# Patient Record
Sex: Female | Born: 1965 | Race: White | Hispanic: No | Marital: Married
Health system: Midwestern US, Academic
[De-identification: ages and names within clinical notes are randomized; demographics above are authoritative.]

---

## 2016-06-01 MED ORDER — HUMIRA PEN 40 MG/0.8 ML SC PNKT
1 refills | Status: DC
Start: 2016-06-01 — End: 2016-07-27

## 2016-06-16 ENCOUNTER — Ambulatory Visit: Admit: 2016-06-16 | Discharge: 2016-06-16 | Payer: Commercial Managed Care - HMO

## 2016-06-16 ENCOUNTER — Ambulatory Visit: Admit: 2016-06-16 | Discharge: 2016-06-16

## 2016-06-16 DIAGNOSIS — R5383 Other fatigue: ICD-10-CM

## 2016-06-16 DIAGNOSIS — Z006 Encounter for examination for normal comparison and control in clinical research program: ICD-10-CM

## 2016-06-16 DIAGNOSIS — K50919 Crohn's disease, unspecified, with unspecified complications: Principal | ICD-10-CM

## 2016-06-16 DIAGNOSIS — Z5181 Encounter for therapeutic drug level monitoring: ICD-10-CM

## 2016-06-16 DIAGNOSIS — K5900 Constipation, unspecified: ICD-10-CM

## 2016-06-16 LAB — CBC AND DIFF
Lab: 0.1 10*3/uL (ref 0–0.20)
Lab: 0.2 10*3/uL (ref 0–0.45)
Lab: 0.5 10*3/uL (ref 0–0.80)
Lab: 1 % (ref 0–2)
Lab: 14 % (ref 11–15)
Lab: 2 % (ref 0–5)
Lab: 2.3 10*3/uL (ref 1.0–4.8)
Lab: 28 % (ref 24–44)
Lab: 31 pg (ref 26–34)
Lab: 320 10*3/uL (ref 150–400)
Lab: 33 g/dL (ref 32.0–36.0)
Lab: 4.6 M/UL (ref 4.0–5.0)
Lab: 42 % (ref 36–45)
Lab: 5.3 10*3/uL (ref 1.8–7.0)
Lab: 6 % (ref 4–12)
Lab: 63 % (ref 41–77)
Lab: 8 FL (ref 7–11)
Lab: 8.4 10*3/uL (ref 4.5–11.0)
Lab: 91 FL (ref 80–100)

## 2016-06-16 LAB — C REACTIVE PROTEIN (CRP): Lab: 0.2 mg/dL (ref <1.0)

## 2016-06-16 LAB — ALBUMIN: Lab: 4.8 g/dL (ref 3.5–5.0)

## 2016-06-16 MED ORDER — GLUCAGON HCL 1 MG IJ SOLR
.4 [IU] | Freq: Once | SUBCUTANEOUS | 0 refills | Status: CP
Start: 2016-06-16 — End: ?
  Administered 2016-06-16: 21:00:00 0.4 mg via SUBCUTANEOUS

## 2016-06-16 MED ORDER — GLUCAGON HCL 1 MG IJ SOLR
.6 [IU] | Freq: Once | INTRAVENOUS | 0 refills | Status: CP
Start: 2016-06-16 — End: ?
  Administered 2016-06-16: 22:00:00 0.6 mg via INTRAVENOUS

## 2016-06-16 MED ORDER — BREEZA FOR NEUTRAL ABDOMINAL/PELVIC IMAGING PO SOLN
1650 mL | Freq: Once | ORAL | 0 refills | Status: AC
Start: 2016-06-16 — End: ?

## 2016-06-16 MED ORDER — GADOBENATE DIMEGLUMINE 529 MG/ML (0.1MMOL/0.2ML) IV SOLN
14 mL | Freq: Once | INTRAVENOUS | 0 refills | Status: CP
Start: 2016-06-16 — End: ?
  Administered 2016-06-16: 22:00:00 14 mL via INTRAVENOUS

## 2016-06-16 MED ORDER — SODIUM CHLORIDE 0.9 % IJ SOLN
50 mL | Freq: Once | INTRAVENOUS | 0 refills | Status: CP
Start: 2016-06-16 — End: ?
  Administered 2016-06-16: 22:00:00 50 mL via INTRAVENOUS

## 2016-07-05 ENCOUNTER — Ambulatory Visit: Admit: 2016-07-05 | Discharge: 2016-07-05 | Payer: Commercial Managed Care - HMO

## 2016-07-05 ENCOUNTER — Encounter: Admit: 2016-07-05 | Discharge: 2016-07-05 | Payer: BC Managed Care – PPO

## 2016-07-05 DIAGNOSIS — K56609 Unspecified intestinal obstruction, unspecified as to partial versus complete obstruction: Principal | ICD-10-CM

## 2016-07-27 ENCOUNTER — Encounter: Admit: 2016-07-27 | Discharge: 2016-07-27 | Payer: BC Managed Care – PPO

## 2016-07-27 MED ORDER — HUMIRA PEN 40 MG/0.8 ML SC PNKT
INJECTION | 2 refills | Status: AC
Start: 2016-07-27 — End: 2016-12-21

## 2016-09-27 ENCOUNTER — Ambulatory Visit: Admit: 2016-09-27 | Discharge: 2016-09-27 | Payer: Commercial Managed Care - HMO

## 2016-09-27 ENCOUNTER — Encounter: Admit: 2016-09-27 | Discharge: 2016-09-27 | Payer: BC Managed Care – PPO

## 2016-09-27 DIAGNOSIS — D229 Melanocytic nevi, unspecified: Principal | ICD-10-CM

## 2016-09-27 DIAGNOSIS — Z5181 Encounter for therapeutic drug level monitoring: Secondary | ICD-10-CM

## 2016-09-27 DIAGNOSIS — H5440 Blindness, one eye, unspecified eye: ICD-10-CM

## 2016-09-27 DIAGNOSIS — L918 Other hypertrophic disorders of the skin: ICD-10-CM

## 2016-09-27 DIAGNOSIS — Z79899 Other long term (current) drug therapy: Principal | ICD-10-CM

## 2016-09-27 DIAGNOSIS — Z9225 Personal history of immunosupression therapy: ICD-10-CM

## 2016-09-27 DIAGNOSIS — Z87828 Personal history of other (healed) physical injury and trauma: ICD-10-CM

## 2016-09-27 DIAGNOSIS — N200 Calculus of kidney: ICD-10-CM

## 2016-09-27 DIAGNOSIS — K5090 Crohn's disease, unspecified, without complications: Principal | ICD-10-CM

## 2016-09-27 DIAGNOSIS — B078 Other viral warts: ICD-10-CM

## 2016-09-27 DIAGNOSIS — K50919 Crohn's disease, unspecified, with unspecified complications: Secondary | ICD-10-CM

## 2016-09-27 LAB — COMPREHENSIVE METABOLIC PANEL
Lab: 0.3 mg/dL (ref 0.3–1.2)
Lab: 0.7 mg/dL — ABNORMAL HIGH (ref 0.4–1.00)
Lab: 107 MMOL/L (ref 98–110)
Lab: 109 mg/dL — ABNORMAL HIGH (ref 70–100)
Lab: 141 MMOL/L (ref 137–147)
Lab: 16 mg/dL (ref 7–25)
Lab: 17 U/L (ref 7–40)
Lab: 18 U/L (ref 7–56)
Lab: 26 MMOL/L (ref 21–30)
Lab: 3.7 MMOL/L (ref 3.5–5.1)
Lab: 4.3 g/dL (ref 3.5–5.0)
Lab: 7.3 g/dL (ref 6.0–8.0)
Lab: 8 (ref 3–12)
Lab: 9.8 mg/dL (ref 8.5–10.6)
Lab: 90 U/L (ref 25–110)
Lab: >60 mL/min (ref >60)
Lab: >60 mL/min (ref >60)

## 2016-09-27 LAB — CBC
Lab: 4.3 M/UL (ref 4.0–5.0)
Lab: 6.7 K/UL — AB (ref 4.5–11.0)

## 2016-09-27 NOTE — Progress Notes
ATTESTATION    I personally performed the key portions of the E/M visit, discussed case with resident and concur with resident documentation of history, physical exam, assessment, and treatment plan unless otherwise noted.    Staff name:  Yailyn Strack Wang-Weinman, MD Date:  09/27/2016

## 2016-12-20 ENCOUNTER — Encounter: Admit: 2016-12-20 | Discharge: 2016-12-20 | Payer: BC Managed Care – PPO

## 2016-12-21 MED ORDER — HUMIRA PEN 40 MG/0.8 ML SC PNKT
1 refills | Status: AC
Start: 2016-12-21 — End: 2017-03-21

## 2016-12-21 NOTE — Telephone Encounter
Pt was last seen 05/22/2016 by Dr. Caesar Chestnut. r

## 2016-12-21 NOTE — Telephone Encounter
Last OV: 05/22/2016  Last labs: 09/27/16     Ref. Range 09/27/2016 14:26   Hemoglobin Latest Ref Range: 12.0 - 15.0 GM/DL 13.5   Hematocrit Latest Ref Range: 36 - 45 % 39.7   Platelet Count Latest Ref Range: 150 - 400 K/UL 299   Inda Blood Cells Latest Ref Range: 4.5 - 11.0 K/UL 6.7   RBC Latest Ref Range: 4.0 - 5.0 M/UL 4.38   MCV Latest Ref Range: 80 - 100 FL 90.7   MCH Latest Ref Range: 26 - 34 PG 30.8   MCHC Latest Ref Range: 32.0 - 36.0 G/DL 34.0   MPV Latest Ref Range: 7 - 11 FL 8.4   RDW Latest Ref Range: 11 - 15 % 15.2 (H)   Sodium Latest Ref Range: 137 - 147 MMOL/L 141   Potassium Latest Ref Range: 3.5 - 5.1 MMOL/L 3.7   Chloride Latest Ref Range: 98 - 110 MMOL/L 107   CO2 Latest Ref Range: 21 - 30 MMOL/L 26   Anion Gap Latest Ref Range: 3 - 12  8   Blood Urea Nitrogen Latest Ref Range: 7 - 25 MG/DL 16   Creatinine Latest Ref Range: 0.4 - 1.00 MG/DL 0.71   eGFR Non African American Latest Ref Range: >60 mL/min >60   eGFR African American Latest Ref Range: >60 mL/min >60   Glucose Latest Ref Range: 70 - 100 MG/DL 109 (H)   Albumin Latest Ref Range: 3.5 - 5.0 G/DL 4.3   Calcium Latest Ref Range: 8.5 - 10.6 MG/DL 9.8   Total Bilirubin Latest Ref Range: 0.3 - 1.2 MG/DL 0.3   Total Protein Latest Ref Range: 6.0 - 8.0 G/DL 7.3     Routing to Dr, Caesar Chestnut to approve/deny

## 2017-03-12 ENCOUNTER — Ambulatory Visit: Admit: 2017-03-12 | Discharge: 2017-03-12 | Payer: Commercial Managed Care - HMO

## 2017-03-12 ENCOUNTER — Encounter: Admit: 2017-03-12 | Discharge: 2017-03-12 | Payer: BC Managed Care – PPO

## 2017-03-12 DIAGNOSIS — K50819 Crohn's disease of both small and large intestine with unspecified complications: Secondary | ICD-10-CM

## 2017-03-12 DIAGNOSIS — K625 Hemorrhage of anus and rectum: ICD-10-CM

## 2017-03-12 DIAGNOSIS — K5090 Crohn's disease, unspecified, without complications: Principal | ICD-10-CM

## 2017-03-12 DIAGNOSIS — M8580 Other specified disorders of bone density and structure, unspecified site: ICD-10-CM

## 2017-03-12 DIAGNOSIS — D848 Other specified immunodeficiencies: ICD-10-CM

## 2017-03-12 DIAGNOSIS — T50905A Adverse effect of unspecified drugs, medicaments and biological substances, initial encounter: ICD-10-CM

## 2017-03-12 DIAGNOSIS — Z79899 Other long term (current) drug therapy: ICD-10-CM

## 2017-03-12 DIAGNOSIS — K5900 Constipation, unspecified: ICD-10-CM

## 2017-03-12 DIAGNOSIS — H5440 Blindness, one eye, unspecified eye: ICD-10-CM

## 2017-03-12 DIAGNOSIS — K50919 Crohn's disease, unspecified, with unspecified complications: Principal | ICD-10-CM

## 2017-03-12 DIAGNOSIS — D849 Immunodeficiency, unspecified: ICD-10-CM

## 2017-03-12 DIAGNOSIS — D899 Disorder involving the immune mechanism, unspecified: ICD-10-CM

## 2017-03-12 DIAGNOSIS — K921 Melena: ICD-10-CM

## 2017-03-12 DIAGNOSIS — N200 Calculus of kidney: ICD-10-CM

## 2017-03-12 MED ORDER — SENNOSIDES 8.6 MG PO TAB
2 | ORAL_TABLET | Freq: Every evening | ORAL | 3 refills | Status: AC | PRN
Start: 2017-03-12 — End: 2017-09-03

## 2017-03-12 MED ORDER — SODIUM,POTASSIUM,MAG SULFATES 17.5-3.13-1.6 GRAM PO SOLR
1 | ORAL | 0 refills | 30.00000 days | Status: AC
Start: 2017-03-12 — End: 2017-12-20

## 2017-03-12 MED ORDER — AZATHIOPRINE 50 MG PO TAB
50 mg | ORAL_TABLET | Freq: Every day | ORAL | 1 refills | Status: AC
Start: 2017-03-12 — End: 2017-06-06

## 2017-03-12 MED ORDER — OMEPRAZOLE 40 MG PO CPDR
40 mg | ORAL_CAPSULE | Freq: Every day | ORAL | 1 refills | Status: AC
Start: 2017-03-12 — End: 2017-03-12

## 2017-03-13 LAB — VITAMIN B12: Lab: 344 pg/mL (ref 180–914)

## 2017-03-13 LAB — COMPREHENSIVE METABOLIC PANEL
Lab: 0.4 mg/dL (ref 0.3–1.2)
Lab: 0.7 mg/dL (ref 0.4–1.00)
Lab: 108 MMOL/L (ref 98–110)
Lab: 141 MMOL/L (ref 137–147)
Lab: 18 mg/dL (ref 7–25)
Lab: 4.1 MMOL/L (ref 3.5–5.1)
Lab: 4.6 g/dL (ref 3.5–5.0)
Lab: 7.5 g/dL (ref 6.0–8.0)
Lab: 88 mg/dL (ref 70–100)
Lab: 9.8 mg/dL (ref 8.5–10.6)

## 2017-03-13 LAB — CBC
Lab: 13 g/dL (ref 12.0–15.0)
Lab: 4.5 M/UL (ref 4.0–5.0)
Lab: 40 % (ref 36–45)
Lab: 7.9 10*3/uL (ref 4.5–11.0)

## 2017-03-13 LAB — 25-OH VITAMIN D (D2 + D3): Lab: 23 ng/mL — ABNORMAL LOW (ref 30–80)

## 2017-03-13 LAB — C REACTIVE PROTEIN (CRP): Lab: 0.2 mg/dL (ref <1.0)

## 2017-03-15 ENCOUNTER — Encounter: Admit: 2017-03-15 | Discharge: 2017-03-15 | Payer: BC Managed Care – PPO

## 2017-03-15 ENCOUNTER — Ambulatory Visit: Admit: 2017-03-15 | Discharge: 2017-03-15 | Payer: Commercial Managed Care - HMO

## 2017-03-15 DIAGNOSIS — D848 Other specified immunodeficiencies: ICD-10-CM

## 2017-03-15 DIAGNOSIS — T50905A Adverse effect of unspecified drugs, medicaments and biological substances, initial encounter: ICD-10-CM

## 2017-03-15 DIAGNOSIS — D849 Immunodeficiency, unspecified: ICD-10-CM

## 2017-03-15 DIAGNOSIS — K50819 Crohn's disease of both small and large intestine with unspecified complications: ICD-10-CM

## 2017-03-15 DIAGNOSIS — Z79899 Other long term (current) drug therapy: ICD-10-CM

## 2017-03-15 DIAGNOSIS — K625 Hemorrhage of anus and rectum: Secondary | ICD-10-CM

## 2017-03-15 DIAGNOSIS — K50919 Crohn's disease, unspecified, with unspecified complications: Principal | ICD-10-CM

## 2017-03-15 DIAGNOSIS — M8580 Other specified disorders of bone density and structure, unspecified site: ICD-10-CM

## 2017-03-15 MED ORDER — CHOLECALCIFEROL (VITAMIN D3) 50,000 UNIT PO CAP
ORAL_CAPSULE | ORAL | 0 refills | 84.00000 days | Status: AC
Start: 2017-03-15 — End: 2017-09-03

## 2017-03-21 ENCOUNTER — Encounter: Admit: 2017-03-21 | Discharge: 2017-03-21 | Payer: BC Managed Care – PPO

## 2017-03-21 MED ORDER — HUMIRA PEN 40 MG/0.8 ML SC PNKT
INJECTION | 0 refills | Status: AC
Start: 2017-03-21 — End: 2017-06-04

## 2017-04-19 ENCOUNTER — Encounter: Admit: 2017-04-19 | Discharge: 2017-04-19 | Payer: BC Managed Care – PPO

## 2017-04-19 DIAGNOSIS — N200 Calculus of kidney: ICD-10-CM

## 2017-04-19 DIAGNOSIS — H5440 Blindness, one eye, unspecified eye: ICD-10-CM

## 2017-04-19 DIAGNOSIS — K5090 Crohn's disease, unspecified, without complications: Principal | ICD-10-CM

## 2017-05-05 ENCOUNTER — Encounter: Admit: 2017-05-05 | Discharge: 2017-05-05 | Payer: BC Managed Care – PPO

## 2017-05-05 DIAGNOSIS — K50919 Crohn's disease, unspecified, with unspecified complications: ICD-10-CM

## 2017-05-05 DIAGNOSIS — R7989 Other specified abnormal findings of blood chemistry: Principal | ICD-10-CM

## 2017-05-07 MED ORDER — CHOLECALCIFEROL (VITAMIN D3) 50,000 UNIT PO CAP
ORAL_CAPSULE | 0 refills
Start: 2017-05-07 — End: ?

## 2017-05-15 ENCOUNTER — Encounter: Admit: 2017-05-15 | Discharge: 2017-05-15 | Payer: BC Managed Care – PPO

## 2017-05-30 ENCOUNTER — Encounter: Admit: 2017-05-30 | Discharge: 2017-05-31 | Payer: BC Managed Care – PPO

## 2017-05-30 ENCOUNTER — Encounter: Admit: 2017-05-30 | Discharge: 2017-05-30 | Payer: BC Managed Care – PPO

## 2017-05-30 ENCOUNTER — Ambulatory Visit: Admit: 2017-05-30 | Discharge: 2017-05-30 | Payer: BC Managed Care – PPO

## 2017-05-30 DIAGNOSIS — K625 Hemorrhage of anus and rectum: ICD-10-CM

## 2017-05-30 DIAGNOSIS — Z5181 Encounter for therapeutic drug level monitoring: ICD-10-CM

## 2017-05-30 DIAGNOSIS — D849 Immunodeficiency, unspecified: ICD-10-CM

## 2017-05-30 DIAGNOSIS — D848 Other specified immunodeficiencies: ICD-10-CM

## 2017-05-30 DIAGNOSIS — Z79899 Other long term (current) drug therapy: ICD-10-CM

## 2017-05-30 DIAGNOSIS — D899 Disorder involving the immune mechanism, unspecified: ICD-10-CM

## 2017-05-30 DIAGNOSIS — K921 Melena: ICD-10-CM

## 2017-05-30 DIAGNOSIS — K50819 Crohn's disease of both small and large intestine with unspecified complications: ICD-10-CM

## 2017-05-30 DIAGNOSIS — K5090 Crohn's disease, unspecified, without complications: ICD-10-CM

## 2017-05-30 DIAGNOSIS — T50905A Adverse effect of unspecified drugs, medicaments and biological substances, initial encounter: ICD-10-CM

## 2017-05-30 DIAGNOSIS — R7989 Other specified abnormal findings of blood chemistry: Principal | ICD-10-CM

## 2017-05-30 DIAGNOSIS — K50919 Crohn's disease, unspecified, with unspecified complications: Principal | ICD-10-CM

## 2017-05-30 LAB — CBC
Lab: 4.7 M/UL (ref 4.0–5.0)
Lab: 8.6 K/UL (ref 4.5–11.0)

## 2017-05-30 LAB — C REACTIVE PROTEIN (CRP): Lab: 0.3 mg/dL (ref <1.0)

## 2017-05-30 LAB — COMPREHENSIVE METABOLIC PANEL
Lab: 0.5 mg/dL (ref >60)
Lab: 0.7 mg/dL — ABNORMAL HIGH (ref 0.4–1.00)
Lab: 124 mg/dL — ABNORMAL HIGH (ref 70–100)
Lab: 140 MMOL/L (ref 137–147)
Lab: 16 mg/dL (ref 7–25)
Lab: 17 U/L (ref 7–56)
Lab: 21 U/L (ref 7–40)
Lab: 29 MMOL/L (ref 21–30)
Lab: 3.6 MMOL/L — ABNORMAL LOW (ref 3.5–5.1)
Lab: 4.6 g/dL (ref 3.5–5.0)
Lab: 7 (ref 3–12)
Lab: 7.6 g/dL (ref >60)
Lab: 9.9 mg/dL (ref 8.5–10.6)
Lab: 96 U/L (ref 25–110)
Lab: >60 mL/min (ref >60)
Lab: >60 mL/min — ABNORMAL HIGH (ref >60)

## 2017-05-30 LAB — MISC REFERENCE TEST

## 2017-05-31 LAB — 25-OH VITAMIN D (D2 + D3): Lab: 57 ng/mL (ref 30–80)

## 2017-06-01 ENCOUNTER — Encounter: Admit: 2017-06-01 | Discharge: 2017-06-01 | Payer: BC Managed Care – PPO

## 2017-06-04 MED ORDER — HUMIRA PEN 40 MG/0.8 ML SC PNKT
0 refills | Status: AC
Start: 2017-06-04 — End: 2017-09-03

## 2017-06-05 ENCOUNTER — Encounter: Admit: 2017-06-05 | Discharge: 2017-06-05 | Payer: BC Managed Care – PPO

## 2017-06-05 DIAGNOSIS — K50919 Crohn's disease, unspecified, with unspecified complications: Principal | ICD-10-CM

## 2017-06-05 MED ORDER — AZATHIOPRINE 50 MG PO TAB
ORAL_TABLET | Freq: Every day | 0 refills
Start: 2017-06-05 — End: ?

## 2017-06-06 MED ORDER — AZATHIOPRINE 50 MG PO TAB
50 mg | ORAL_TABLET | Freq: Every day | ORAL | 1 refills | Status: AC
Start: 2017-06-06 — End: 2017-06-08

## 2017-06-07 ENCOUNTER — Encounter: Admit: 2017-06-07 | Discharge: 2017-06-07 | Payer: BC Managed Care – PPO

## 2017-06-08 MED ORDER — AZATHIOPRINE 50 MG PO TAB
50 mg | ORAL_TABLET | Freq: Every day | ORAL | 1 refills | Status: AC
Start: 2017-06-08 — End: 2017-09-03

## 2017-06-13 ENCOUNTER — Encounter: Admit: 2017-06-13 | Discharge: 2017-06-13 | Payer: BC Managed Care – PPO

## 2017-06-16 ENCOUNTER — Ambulatory Visit: Admit: 2017-06-16 | Discharge: 2017-06-16 | Payer: BC Managed Care – PPO

## 2017-06-16 DIAGNOSIS — K50919 Crohn's disease, unspecified, with unspecified complications: Principal | ICD-10-CM

## 2017-06-16 MED ORDER — GLUCAGON HCL 1 MG IJ SOLR
1 [IU] | Freq: Once | SUBCUTANEOUS | 0 refills | Status: CP
Start: 2017-06-16 — End: ?
  Administered 2017-06-16: 14:00:00 1 mg via SUBCUTANEOUS

## 2017-06-16 MED ORDER — GADOBENATE DIMEGLUMINE 529 MG/ML (0.1MMOL/0.2ML) IV SOLN
15 mL | Freq: Once | INTRAVENOUS | 0 refills | Status: CP
Start: 2017-06-16 — End: ?
  Administered 2017-06-16: 15:00:00 13 mL via INTRAVENOUS

## 2017-06-16 MED ORDER — BREEZA FOR NEUTRAL ABDOMINAL/PELVIC IMAGING PO SOLN
1000 mL | Freq: Once | ORAL | 0 refills | Status: CP
Start: 2017-06-16 — End: ?

## 2017-06-16 MED ORDER — SODIUM CHLORIDE 0.9 % IJ SOLN
50 mL | Freq: Once | INTRAVENOUS | 0 refills | Status: CP
Start: 2017-06-16 — End: ?
  Administered 2017-06-16: 15:00:00 50 mL via INTRAVENOUS

## 2017-06-18 ENCOUNTER — Encounter: Admit: 2017-06-18 | Discharge: 2017-06-18 | Payer: BC Managed Care – PPO

## 2017-06-20 ENCOUNTER — Encounter: Admit: 2017-06-20 | Discharge: 2017-06-20 | Payer: BC Managed Care – PPO

## 2017-06-20 DIAGNOSIS — D899 Disorder involving the immune mechanism, unspecified: ICD-10-CM

## 2017-06-20 DIAGNOSIS — K5000 Crohn's disease of small intestine without complications: Principal | ICD-10-CM

## 2017-06-27 DIAGNOSIS — K5900 Constipation, unspecified: ICD-10-CM

## 2017-06-27 DIAGNOSIS — K625 Hemorrhage of anus and rectum: ICD-10-CM

## 2017-06-27 DIAGNOSIS — K921 Melena: ICD-10-CM

## 2017-07-04 ENCOUNTER — Encounter: Admit: 2017-07-04 | Discharge: 2017-07-04 | Payer: BC Managed Care – PPO

## 2017-07-04 ENCOUNTER — Ambulatory Visit: Admit: 2017-07-04 | Discharge: 2017-07-05 | Payer: BC Managed Care – PPO

## 2017-07-04 DIAGNOSIS — K50119 Crohn's disease of large intestine with unspecified complications: Principal | ICD-10-CM

## 2017-07-04 DIAGNOSIS — N200 Calculus of kidney: ICD-10-CM

## 2017-07-04 DIAGNOSIS — K5090 Crohn's disease, unspecified, without complications: Principal | ICD-10-CM

## 2017-07-04 DIAGNOSIS — H5440 Blindness, one eye, unspecified eye: ICD-10-CM

## 2017-07-06 ENCOUNTER — Encounter: Admit: 2017-07-06 | Discharge: 2017-07-06 | Payer: BC Managed Care – PPO

## 2017-07-06 DIAGNOSIS — D509 Iron deficiency anemia, unspecified: ICD-10-CM

## 2017-07-06 DIAGNOSIS — K50919 Crohn's disease, unspecified, with unspecified complications: Principal | ICD-10-CM

## 2017-07-06 DIAGNOSIS — Z79899 Other long term (current) drug therapy: ICD-10-CM

## 2017-07-06 DIAGNOSIS — D899 Disorder involving the immune mechanism, unspecified: ICD-10-CM

## 2017-07-06 DIAGNOSIS — Z5181 Encounter for therapeutic drug level monitoring: ICD-10-CM

## 2017-07-08 ENCOUNTER — Encounter: Admit: 2017-07-08 | Discharge: 2017-07-08 | Payer: BC Managed Care – PPO

## 2017-07-08 DIAGNOSIS — K5090 Crohn's disease, unspecified, without complications: Principal | ICD-10-CM

## 2017-07-08 DIAGNOSIS — N200 Calculus of kidney: ICD-10-CM

## 2017-07-08 DIAGNOSIS — H5440 Blindness, one eye, unspecified eye: ICD-10-CM

## 2017-07-10 ENCOUNTER — Ambulatory Visit: Admit: 2017-07-10 | Discharge: 2017-07-10 | Payer: BC Managed Care – PPO

## 2017-07-10 DIAGNOSIS — K5000 Crohn's disease of small intestine without complications: Principal | ICD-10-CM

## 2017-07-10 DIAGNOSIS — D899 Disorder involving the immune mechanism, unspecified: ICD-10-CM

## 2017-07-12 MED ORDER — SODIUM,POTASSIUM,MAG SULFATES 17.5-3.13-1.6 GRAM PO SOLR
1 | ORAL | 0 refills | 30.00000 days | Status: AC
Start: 2017-07-12 — End: 2017-09-03

## 2017-07-24 ENCOUNTER — Ambulatory Visit: Admit: 2017-07-24 | Discharge: 2017-07-24 | Payer: BC Managed Care – PPO

## 2017-07-25 ENCOUNTER — Ambulatory Visit: Admit: 2017-07-25 | Discharge: 2017-07-25 | Payer: BC Managed Care – PPO

## 2017-07-25 ENCOUNTER — Encounter: Admit: 2017-07-25 | Discharge: 2017-07-25 | Payer: BC Managed Care – PPO

## 2017-07-25 ENCOUNTER — Ambulatory Visit: Admit: 2017-07-25 | Discharge: 2017-07-26 | Payer: BC Managed Care – PPO

## 2017-07-25 DIAGNOSIS — H5440 Blindness, one eye, unspecified eye: ICD-10-CM

## 2017-07-25 DIAGNOSIS — N200 Calculus of kidney: ICD-10-CM

## 2017-07-25 DIAGNOSIS — K5090 Crohn's disease, unspecified, without complications: Principal | ICD-10-CM

## 2017-07-25 MED ORDER — ONDANSETRON HCL (PF) 4 MG/2 ML IJ SOLN
4 mg | Freq: Once | INTRAVENOUS | 0 refills | Status: AC | PRN
Start: 2017-07-25 — End: ?

## 2017-07-25 MED ORDER — LIDOCAINE (PF) 200 MG/10 ML (2 %) IJ SYRG
0 refills | Status: DC
Start: 2017-07-25 — End: 2017-07-25

## 2017-07-25 MED ORDER — PROPOFOL 10 MG/ML IV EMUL 20 ML (INFUSION)(AM)(OR)
INTRAVENOUS | 0 refills | Status: DC
Start: 2017-07-25 — End: 2017-07-25

## 2017-07-25 MED ORDER — SIMETHICONE 40 MG/0.6 ML PO DRPS
0 refills | Status: DC
Start: 2017-07-25 — End: 2017-07-30

## 2017-07-25 MED ORDER — PROPOFOL INJ 10 MG/ML IV VIAL
0 refills | Status: DC
Start: 2017-07-25 — End: 2017-07-25

## 2017-07-25 MED ORDER — LACTATED RINGERS IV SOLP
INTRAVENOUS | 0 refills | Status: AC
Start: 2017-07-25 — End: ?

## 2017-07-25 MED ORDER — LIDOCAINE (PF) 10 MG/ML (1 %) IJ SOLN
.1-2 mL | INTRAMUSCULAR | 0 refills | Status: DC | PRN
Start: 2017-07-25 — End: 2017-11-02

## 2017-07-26 ENCOUNTER — Encounter: Admit: 2017-07-26 | Discharge: 2017-07-26 | Payer: BC Managed Care – PPO

## 2017-07-26 DIAGNOSIS — K5090 Crohn's disease, unspecified, without complications: Principal | ICD-10-CM

## 2017-07-26 DIAGNOSIS — H5440 Blindness, one eye, unspecified eye: ICD-10-CM

## 2017-07-26 DIAGNOSIS — N200 Calculus of kidney: ICD-10-CM

## 2017-08-06 ENCOUNTER — Ambulatory Visit: Admit: 2017-08-06 | Discharge: 2017-08-07 | Payer: BC Managed Care – PPO

## 2017-08-17 ENCOUNTER — Encounter: Admit: 2017-08-17 | Discharge: 2017-08-17 | Payer: BC Managed Care – PPO

## 2017-08-17 DIAGNOSIS — N1330 Unspecified hydronephrosis: Principal | ICD-10-CM

## 2017-08-17 DIAGNOSIS — N134 Hydroureter: ICD-10-CM

## 2017-09-03 ENCOUNTER — Ambulatory Visit: Admit: 2017-09-03 | Discharge: 2017-09-03 | Payer: BC Managed Care – PPO

## 2017-09-03 ENCOUNTER — Encounter: Admit: 2017-09-03 | Discharge: 2017-09-03 | Payer: BC Managed Care – PPO

## 2017-09-03 DIAGNOSIS — N200 Calculus of kidney: ICD-10-CM

## 2017-09-03 DIAGNOSIS — K921 Melena: ICD-10-CM

## 2017-09-03 DIAGNOSIS — Z79899 Other long term (current) drug therapy: ICD-10-CM

## 2017-09-03 DIAGNOSIS — K50819 Crohn's disease of both small and large intestine with unspecified complications: ICD-10-CM

## 2017-09-03 DIAGNOSIS — T50905A Adverse effect of unspecified drugs, medicaments and biological substances, initial encounter: ICD-10-CM

## 2017-09-03 DIAGNOSIS — K635 Polyp of colon: ICD-10-CM

## 2017-09-03 DIAGNOSIS — K625 Hemorrhage of anus and rectum: ICD-10-CM

## 2017-09-03 DIAGNOSIS — D848 Other specified immunodeficiencies: ICD-10-CM

## 2017-09-03 DIAGNOSIS — D899 Disorder involving the immune mechanism, unspecified: ICD-10-CM

## 2017-09-03 DIAGNOSIS — N134 Hydroureter: ICD-10-CM

## 2017-09-03 DIAGNOSIS — K50919 Crohn's disease, unspecified, with unspecified complications: Principal | ICD-10-CM

## 2017-09-03 DIAGNOSIS — N1330 Unspecified hydronephrosis: ICD-10-CM

## 2017-09-03 DIAGNOSIS — D649 Anemia, unspecified: ICD-10-CM

## 2017-09-03 DIAGNOSIS — H5440 Blindness, one eye, unspecified eye: ICD-10-CM

## 2017-09-03 DIAGNOSIS — K5090 Crohn's disease, unspecified, without complications: Principal | ICD-10-CM

## 2017-09-03 LAB — CBC
Lab: 12 g/dL (ref 12.0–15.0)
Lab: 16 % — ABNORMAL HIGH (ref 11–15)
Lab: 304 10*3/uL (ref 150–400)
Lab: 33 g/dL (ref 32.0–36.0)
Lab: 36 % (ref 36–45)
Lab: 4.2 M/UL (ref 4.0–5.0)
Lab: 6.6 10*3/uL (ref 4.5–11.0)
Lab: 8 FL (ref 7–11)
Lab: 85 FL (ref 80–100)

## 2017-09-03 LAB — C REACTIVE PROTEIN (CRP): Lab: 0 mg/dL (ref <1.0)

## 2017-09-03 LAB — COMPREHENSIVE METABOLIC PANEL
Lab: 139 MMOL/L (ref 137–147)
Lab: 18 U/L (ref 7–56)
Lab: 28 MMOL/L (ref 21–30)
Lab: 4.5 MMOL/L (ref 3.5–5.1)
Lab: >60 mL/min (ref >60)
Lab: >60 mL/min (ref >60)
Lab: 5 (ref 3–12)

## 2017-09-06 ENCOUNTER — Encounter: Admit: 2017-09-06 | Discharge: 2017-09-06 | Payer: BC Managed Care – PPO

## 2017-09-09 ENCOUNTER — Encounter: Admit: 2017-09-09 | Discharge: 2017-09-09 | Payer: BC Managed Care – PPO

## 2017-09-09 DIAGNOSIS — N200 Calculus of kidney: ICD-10-CM

## 2017-09-09 DIAGNOSIS — K5090 Crohn's disease, unspecified, without complications: Principal | ICD-10-CM

## 2017-09-09 DIAGNOSIS — H5440 Blindness, one eye, unspecified eye: ICD-10-CM

## 2017-09-09 DIAGNOSIS — K635 Polyp of colon: ICD-10-CM

## 2017-09-09 DIAGNOSIS — D649 Anemia, unspecified: ICD-10-CM

## 2017-09-18 ENCOUNTER — Ambulatory Visit: Admit: 2017-09-18 | Discharge: 2017-09-18 | Payer: BC Managed Care – PPO

## 2017-09-18 DIAGNOSIS — N200 Calculus of kidney: Principal | ICD-10-CM

## 2017-09-24 ENCOUNTER — Encounter: Admit: 2017-09-24 | Discharge: 2017-09-24 | Payer: BC Managed Care – PPO

## 2017-11-02 ENCOUNTER — Encounter: Admit: 2017-11-02 | Discharge: 2017-11-02 | Payer: BC Managed Care – PPO

## 2017-11-02 DIAGNOSIS — N200 Calculus of kidney: ICD-10-CM

## 2017-11-02 DIAGNOSIS — K635 Polyp of colon: ICD-10-CM

## 2017-11-02 DIAGNOSIS — H5440 Blindness, one eye, unspecified eye: ICD-10-CM

## 2017-11-02 DIAGNOSIS — K5090 Crohn's disease, unspecified, without complications: ICD-10-CM

## 2017-11-02 DIAGNOSIS — D649 Anemia, unspecified: ICD-10-CM

## 2017-11-05 ENCOUNTER — Encounter: Admit: 2017-11-05 | Discharge: 2017-11-05 | Payer: BC Managed Care – PPO

## 2017-11-05 ENCOUNTER — Ambulatory Visit: Admit: 2017-11-05 | Discharge: 2017-11-06 | Payer: BC Managed Care – PPO

## 2017-11-05 MED ORDER — SODIUM,POTASSIUM,MAG SULFATES 17.5-3.13-1.6 GRAM PO SOLR
0 refills | 30.00000 days | Status: DC
Start: 2017-11-05 — End: 2017-12-20
  Filled 2017-11-05 (×2): qty 354, 1d supply, fill #1

## 2017-11-07 ENCOUNTER — Ambulatory Visit: Admit: 2017-11-07 | Discharge: 2017-11-07 | Payer: BC Managed Care – PPO

## 2017-11-07 ENCOUNTER — Encounter: Admit: 2017-11-07 | Discharge: 2017-11-07 | Payer: BC Managed Care – PPO

## 2017-11-07 ENCOUNTER — Ambulatory Visit: Admit: 2017-11-07 | Discharge: 2017-11-08 | Payer: BC Managed Care – PPO

## 2017-11-07 DIAGNOSIS — N200 Calculus of kidney: ICD-10-CM

## 2017-11-07 DIAGNOSIS — H5440 Blindness, one eye, unspecified eye: ICD-10-CM

## 2017-11-07 DIAGNOSIS — K635 Polyp of colon: ICD-10-CM

## 2017-11-07 DIAGNOSIS — D649 Anemia, unspecified: ICD-10-CM

## 2017-11-07 DIAGNOSIS — K5090 Crohn's disease, unspecified, without complications: Principal | ICD-10-CM

## 2017-11-07 MED ORDER — ONDANSETRON HCL (PF) 4 MG/2 ML IJ SOLN
4 mg | Freq: Once | INTRAVENOUS | 0 refills | Status: AC | PRN
Start: 2017-11-07 — End: ?

## 2017-11-07 MED ORDER — FENTANYL CITRATE (PF) 50 MCG/ML IJ SOLN
25 ug | INTRAVENOUS | 0 refills | Status: DC | PRN
Start: 2017-11-07 — End: 2017-11-12

## 2017-11-07 MED ORDER — PROPOFOL INJ 10 MG/ML IV VIAL
0 refills | Status: DC
Start: 2017-11-07 — End: 2017-11-07

## 2017-11-07 MED ORDER — ONDANSETRON 4 MG PO TBDI
4 mg | Freq: Once | ORAL | 0 refills | Status: AC | PRN
Start: 2017-11-07 — End: ?

## 2017-11-07 MED ORDER — LACTATED RINGERS IV SOLP
1000 mL | INTRAVENOUS | 0 refills | Status: DC
Start: 2017-11-07 — End: 2017-11-12

## 2017-11-07 MED ORDER — GLYCOPYRROLATE 0.2 MG/ML IJ SOLN
.2 mg | Freq: Once | INTRAVENOUS | 0 refills | Status: AC | PRN
Start: 2017-11-07 — End: ?

## 2017-11-07 MED ORDER — SIMETHICONE 40 MG/0.6 ML PO DRPS
0 refills | Status: DC
Start: 2017-11-07 — End: 2017-11-12

## 2017-11-07 MED ORDER — PROPOFOL 10 MG/ML IV EMUL 20 ML (INFUSION)(AM)(OR)
INTRAVENOUS | 0 refills | Status: DC
Start: 2017-11-07 — End: 2017-11-07

## 2017-11-07 MED ORDER — LIDOCAINE (PF) 10 MG/ML (1 %) IJ SOLN
.1-2 mL | INTRAMUSCULAR | 0 refills | Status: DC | PRN
Start: 2017-11-07 — End: 2017-11-12

## 2017-11-08 ENCOUNTER — Encounter: Admit: 2017-11-08 | Discharge: 2017-11-08 | Payer: BC Managed Care – PPO

## 2017-11-08 ENCOUNTER — Ambulatory Visit: Admit: 2017-11-08 | Discharge: 2017-11-09 | Payer: BC Managed Care – PPO

## 2017-11-08 DIAGNOSIS — K5090 Crohn's disease, unspecified, without complications: ICD-10-CM

## 2017-11-08 DIAGNOSIS — D649 Anemia, unspecified: ICD-10-CM

## 2017-11-08 DIAGNOSIS — K635 Polyp of colon: ICD-10-CM

## 2017-11-08 DIAGNOSIS — H5440 Blindness, one eye, unspecified eye: ICD-10-CM

## 2017-11-08 DIAGNOSIS — N200 Calculus of kidney: ICD-10-CM

## 2017-11-09 ENCOUNTER — Encounter: Admit: 2017-11-09 | Discharge: 2017-11-09 | Payer: BC Managed Care – PPO

## 2017-11-09 DIAGNOSIS — N200 Calculus of kidney: Principal | ICD-10-CM

## 2017-11-09 LAB — CULTURE-URINE W/SENSITIVITY

## 2017-12-10 ENCOUNTER — Encounter: Admit: 2017-12-10 | Discharge: 2017-12-10 | Payer: BC Managed Care – PPO

## 2017-12-10 DIAGNOSIS — Z1231 Encounter for screening mammogram for malignant neoplasm of breast: Principal | ICD-10-CM

## 2017-12-11 ENCOUNTER — Ambulatory Visit: Admit: 2017-12-11 | Discharge: 2017-12-11 | Payer: BC Managed Care – PPO

## 2017-12-11 DIAGNOSIS — Z1231 Encounter for screening mammogram for malignant neoplasm of breast: Principal | ICD-10-CM

## 2017-12-20 ENCOUNTER — Ambulatory Visit: Admit: 2017-12-20 | Discharge: 2017-12-20 | Payer: BC Managed Care – PPO

## 2017-12-20 ENCOUNTER — Encounter: Admit: 2017-12-20 | Discharge: 2017-12-20 | Payer: BC Managed Care – PPO

## 2017-12-20 ENCOUNTER — Ambulatory Visit: Admit: 2017-12-20 | Discharge: 2017-12-21 | Payer: BC Managed Care – PPO

## 2017-12-20 DIAGNOSIS — K635 Polyp of colon: ICD-10-CM

## 2017-12-20 DIAGNOSIS — K5090 Crohn's disease, unspecified, without complications: Principal | ICD-10-CM

## 2017-12-20 DIAGNOSIS — D649 Anemia, unspecified: ICD-10-CM

## 2017-12-20 DIAGNOSIS — N200 Calculus of kidney: Principal | ICD-10-CM

## 2017-12-20 DIAGNOSIS — H5440 Blindness, one eye, unspecified eye: ICD-10-CM

## 2017-12-20 LAB — MAGNESIUM: Lab: 2.2 mg/dL (ref 1.6–2.6)

## 2018-01-04 ENCOUNTER — Ambulatory Visit: Admit: 2018-01-04 | Discharge: 2018-01-05 | Payer: BC Managed Care – PPO

## 2018-01-10 ENCOUNTER — Encounter: Admit: 2018-01-10 | Discharge: 2018-01-10 | Payer: BC Managed Care – PPO

## 2018-01-10 DIAGNOSIS — N200 Calculus of kidney: Principal | ICD-10-CM

## 2018-02-21 ENCOUNTER — Ambulatory Visit: Admit: 2018-02-21 | Discharge: 2018-02-22 | Payer: BC Managed Care – PPO

## 2018-03-18 ENCOUNTER — Encounter: Admit: 2018-03-18 | Discharge: 2018-03-18 | Payer: BC Managed Care – PPO

## 2018-03-18 NOTE — Telephone Encounter
Patient called to advise she has lost order for [facility] 24 hr urine test.  Request that another order be emailed to her at [email or web address].    Order emailed.

## 2018-04-19 ENCOUNTER — Encounter: Admit: 2018-04-19 | Discharge: 2018-04-19 | Payer: BC Managed Care – PPO

## 2018-04-19 ENCOUNTER — Ambulatory Visit: Admit: 2018-04-19 | Discharge: 2018-04-19 | Payer: BC Managed Care – PPO

## 2018-04-19 DIAGNOSIS — R197 Diarrhea, unspecified: ICD-10-CM

## 2018-04-19 DIAGNOSIS — R079 Chest pain, unspecified: ICD-10-CM

## 2018-04-19 DIAGNOSIS — R0602 Shortness of breath: ICD-10-CM

## 2018-04-19 DIAGNOSIS — Z1159 Encounter for screening for other viral diseases: Principal | ICD-10-CM

## 2018-04-19 DIAGNOSIS — R5383 Other fatigue: Secondary | ICD-10-CM

## 2018-04-19 NOTE — Progress Notes
Patient arrived to COVID clinic for COVID-19 testing 04/19/18 1334. Patient identity confirmed via photo I.D. Nasopharyngeal procedure explained to the patient.   Nasopharyngeal swab completed left  Patient education provided given and instructed patient self isolate until contacted w/ results and further instructions.   Swab collected by Alexis Goodell CMA.

## 2018-05-06 ENCOUNTER — Encounter: Admit: 2018-05-06 | Discharge: 2018-05-06 | Payer: BC Managed Care – PPO

## 2018-05-10 ENCOUNTER — Encounter: Admit: 2018-05-10 | Discharge: 2018-05-10 | Payer: BC Managed Care – PPO

## 2018-05-10 DIAGNOSIS — K625 Hemorrhage of anus and rectum: Principal | ICD-10-CM

## 2018-05-10 DIAGNOSIS — R198 Other specified symptoms and signs involving the digestive system and abdomen: ICD-10-CM

## 2018-05-10 DIAGNOSIS — K50919 Crohn's disease, unspecified, with unspecified complications: ICD-10-CM

## 2018-05-10 NOTE — Progress Notes
telehealth visit via [employer].  Bother and step dada need 24 care and she still has difficult grieving.   Because of all the stresses, she ahs noted  BRBPR on the toliet, mixed with mucous with blood clot. She has had it for about a week and has some pain. She has formed bowel movement, BSS type 5-6. No fever.    She saw Zollie Scale in Feb? (not sure) for blood tests but saw her last week for changing meds. She hasn't slept since her mother passed away. Care placement for her step father and brother can't be done due to COVID19.       Physical Exam:  GENERAL:   Alert and oriented x 3, not in acute distress, looking stated age.  HEAD:  Normocephalic, atraumatic.  EYES:  EOM intact, no conjunctivitis or jaundice.  ENT:  Oropharynx is moist, no nasal discharge.  NECK: ROM is normal,  Supple, no thyromegaly or bruit.  SKIN: No rash or jaundice. Noedema.   LUNGS: Normal effort, no respiratory distress or use of accessory muscles   ABDOMEN: central umbilicus, non-distended,   NEURO: cranial nerves are grossly intact, no tremor,  Moves all 4 extremities without difficulty, gait is appropriate.  PSYCH: good memory,  Appropriate mood and behavior.

## 2018-05-14 ENCOUNTER — Ambulatory Visit: Admit: 2018-05-14 | Discharge: 2018-05-14 | Payer: BC Managed Care – PPO

## 2018-05-14 ENCOUNTER — Encounter: Admit: 2018-05-14 | Discharge: 2018-05-14 | Payer: BC Managed Care – PPO

## 2018-05-14 DIAGNOSIS — N200 Calculus of kidney: ICD-10-CM

## 2018-05-14 DIAGNOSIS — H5440 Blindness, one eye, unspecified eye: ICD-10-CM

## 2018-05-14 DIAGNOSIS — D649 Anemia, unspecified: ICD-10-CM

## 2018-05-14 DIAGNOSIS — K5000 Crohn's disease of small intestine without complications: Principal | ICD-10-CM

## 2018-05-14 DIAGNOSIS — K5090 Crohn's disease, unspecified, without complications: ICD-10-CM

## 2018-05-14 DIAGNOSIS — K635 Polyp of colon: ICD-10-CM

## 2018-05-22 ENCOUNTER — Encounter: Admit: 2018-05-22 | Discharge: 2018-05-22 | Payer: BC Managed Care – PPO

## 2018-05-22 ENCOUNTER — Ambulatory Visit: Admit: 2018-06-12 | Discharge: 2018-06-12 | Payer: BC Managed Care – PPO

## 2018-05-22 DIAGNOSIS — K50911 Crohn's disease, unspecified, with rectal bleeding: ICD-10-CM

## 2018-05-22 DIAGNOSIS — K625 Hemorrhage of anus and rectum: Principal | ICD-10-CM

## 2018-05-22 DIAGNOSIS — R198 Other specified symptoms and signs involving the digestive system and abdomen: ICD-10-CM

## 2018-05-22 DIAGNOSIS — Z03818 Encounter for observation for suspected exposure to other biological agents ruled out: Principal | ICD-10-CM

## 2018-05-28 ENCOUNTER — Encounter: Admit: 2018-05-28 | Discharge: 2018-05-28 | Payer: BC Managed Care – PPO

## 2018-05-28 DIAGNOSIS — M21619 Bunion of unspecified foot: ICD-10-CM

## 2018-05-28 DIAGNOSIS — K635 Polyp of colon: ICD-10-CM

## 2018-05-28 DIAGNOSIS — K5090 Crohn's disease, unspecified, without complications: ICD-10-CM

## 2018-05-28 DIAGNOSIS — D649 Anemia, unspecified: ICD-10-CM

## 2018-05-28 DIAGNOSIS — H5440 Blindness, one eye, unspecified eye: Principal | ICD-10-CM

## 2018-05-28 DIAGNOSIS — N200 Calculus of kidney: ICD-10-CM

## 2018-06-05 ENCOUNTER — Encounter: Admit: 2018-06-05 | Discharge: 2018-06-05 | Payer: BC Managed Care – PPO

## 2018-06-05 NOTE — Telephone Encounter
Called pt. Pt still plans on having procedure but she was given the wrong date of the swab. Her email stated 5/20. Pt also states her father passed away this morning and its probably for the best she reschedule the procedure. Mother passed away just 1 month ago. Informed pt I will email scheduling to cancel and re-schedule for next week. They will also help arrange the Covid test. Informed pt if she doesn't hear from scheduling by Friday, please call. Pt confirmed she has [facility] number. Pt had no further questions or concerns.     Routing to Dr. Burnis Medin as Lorain Childes.

## 2018-06-05 NOTE — Telephone Encounter
Called GI clinic to inform them their patient was a no show to their swab clinic appointment for pre-op testing. LVM with Dr. Milus Banister nurse to inform them of the no show status for the swab appointment. Let them know that it is provider discretion to proceed with the procedure or if they need to reschedule they can call back and we can schedule the patient. Provided call back number for any additional questions.

## 2018-06-06 ENCOUNTER — Encounter: Admit: 2018-06-06 | Discharge: 2018-06-06 | Payer: BC Managed Care – PPO

## 2018-06-06 DIAGNOSIS — N200 Calculus of kidney: ICD-10-CM

## 2018-06-06 DIAGNOSIS — K5090 Crohn's disease, unspecified, without complications: ICD-10-CM

## 2018-06-06 DIAGNOSIS — D649 Anemia, unspecified: ICD-10-CM

## 2018-06-06 DIAGNOSIS — H5440 Blindness, one eye, unspecified eye: Principal | ICD-10-CM

## 2018-06-06 DIAGNOSIS — K635 Polyp of colon: ICD-10-CM

## 2018-06-06 DIAGNOSIS — M21619 Bunion of unspecified foot: ICD-10-CM

## 2018-06-07 ENCOUNTER — Ambulatory Visit: Admit: 2018-06-07 | Discharge: 2018-06-08 | Payer: BC Managed Care – PPO

## 2018-06-07 ENCOUNTER — Encounter: Admit: 2018-06-07 | Discharge: 2018-06-07 | Payer: BC Managed Care – PPO

## 2018-06-08 ENCOUNTER — Encounter: Admit: 2018-06-08 | Discharge: 2018-06-08 | Payer: BC Managed Care – PPO

## 2018-06-08 DIAGNOSIS — R198 Other specified symptoms and signs involving the digestive system and abdomen: ICD-10-CM

## 2018-06-08 DIAGNOSIS — K625 Hemorrhage of anus and rectum: Principal | ICD-10-CM

## 2018-06-08 DIAGNOSIS — Z1159 Encounter for screening for other viral diseases: Principal | ICD-10-CM

## 2018-06-08 DIAGNOSIS — K50919 Crohn's disease, unspecified, with unspecified complications: ICD-10-CM

## 2018-06-08 LAB — CBC
Lab: 11 g/dL — ABNORMAL LOW (ref 12.0–15.0)
Lab: 18 % — ABNORMAL HIGH (ref 11–15)
Lab: 26 pg (ref 26–34)
Lab: 32 g/dL (ref 32.0–36.0)
Lab: 354 10*3/uL (ref 150–400)
Lab: 36 % (ref 36–45)
Lab: 4.3 M/UL (ref 4.0–5.0)
Lab: 8.2 FL (ref 7–11)
Lab: 8.8 10*3/uL (ref 4.5–11.0)
Lab: 83 FL (ref 80–100)

## 2018-06-08 LAB — SED RATE: Lab: 4 mm/h (ref 0–30)

## 2018-06-08 LAB — C REACTIVE PROTEIN (CRP): Lab: 0 mg/dL (ref <1.0)

## 2018-06-09 ENCOUNTER — Encounter: Admit: 2018-06-09 | Discharge: 2018-06-09 | Payer: BC Managed Care – PPO

## 2018-06-12 ENCOUNTER — Encounter: Admit: 2018-06-12 | Discharge: 2018-06-12 | Payer: BC Managed Care – PPO

## 2018-06-12 ENCOUNTER — Ambulatory Visit: Admit: 2018-06-12 | Discharge: 2018-06-13 | Payer: BC Managed Care – PPO

## 2018-06-12 DIAGNOSIS — K5080 Crohn's disease of both small and large intestine without complications: ICD-10-CM

## 2018-06-12 DIAGNOSIS — R198 Other specified symptoms and signs involving the digestive system and abdomen: ICD-10-CM

## 2018-06-12 DIAGNOSIS — D649 Anemia, unspecified: Principal | ICD-10-CM

## 2018-06-12 DIAGNOSIS — K50911 Crohn's disease, unspecified, with rectal bleeding: Secondary | ICD-10-CM

## 2018-06-12 DIAGNOSIS — K625 Hemorrhage of anus and rectum: Principal | ICD-10-CM

## 2018-06-12 DIAGNOSIS — H5440 Blindness, one eye, unspecified eye: Principal | ICD-10-CM

## 2018-06-12 DIAGNOSIS — K5090 Crohn's disease, unspecified, without complications: ICD-10-CM

## 2018-06-12 DIAGNOSIS — N200 Calculus of kidney: ICD-10-CM

## 2018-06-12 DIAGNOSIS — M21619 Bunion of unspecified foot: ICD-10-CM

## 2018-06-12 DIAGNOSIS — K635 Polyp of colon: ICD-10-CM

## 2018-06-12 LAB — IRON + BINDING CAPACITY + %SAT+ FERRITIN
Lab: 39 ug/dL — ABNORMAL LOW (ref 50–160)
Lab: 524 ug/dL — ABNORMAL HIGH (ref 270–380)
Lab: 6 ng/mL — ABNORMAL LOW (ref 10–200)
Lab: 7 % — ABNORMAL LOW (ref 28–42)

## 2018-06-12 LAB — VITAMIN B12: Lab: 276 pg/mL (ref 180–914)

## 2018-06-12 MED ORDER — ONDANSETRON HCL (PF) 4 MG/2 ML IJ SOLN
4 mg | Freq: Once | INTRAVENOUS | 0 refills | Status: DC | PRN
Start: 2018-06-12 — End: 2018-06-17

## 2018-06-12 MED ORDER — LACTATED RINGERS IV SOLP
1000 mL | INTRAVENOUS | 0 refills | Status: DC
Start: 2018-06-12 — End: 2018-06-17

## 2018-06-12 MED ORDER — HALOPERIDOL LACTATE 5 MG/ML IJ SOLN
1 mg | Freq: Once | INTRAVENOUS | 0 refills | Status: DC | PRN
Start: 2018-06-12 — End: 2018-06-17

## 2018-06-12 MED ORDER — PROPOFOL 10 MG/ML IV EMUL 50 ML (INFUSION)(AM)(OR)
INTRAVENOUS | 0 refills | Status: DC
Start: 2018-06-12 — End: 2018-06-12

## 2018-06-12 MED ORDER — LACTATED RINGERS IV SOLP
INTRAVENOUS | 0 refills | Status: DC
Start: 2018-06-12 — End: 2018-06-17

## 2018-06-12 MED ORDER — LIDOCAINE (PF) 10 MG/ML (1 %) IJ SOLN
.1-2 mL | INTRAMUSCULAR | 0 refills | Status: DC | PRN
Start: 2018-06-12 — End: 2018-06-17

## 2018-06-12 NOTE — Progress Notes
Iron infusion, injectafer. 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

## 2018-06-12 NOTE — H&P (View-Only)
AMC 0.50 06/16/2016    EOSA 2 06/16/2016    ABC 0.10 06/16/2016    MCV 83.0 06/07/2018    MCH 26.7 06/07/2018    MCHC 32.1 06/07/2018    MPV 8.2 06/07/2018    RDW 18.2 06/07/2018    and Coagulation:  No results found for: PT, PTT, INR    ATTESTATION  I personally performed the key portions of the E/M visit, discussed case with fellow and concur with fellow documentation of history, physical exam, assessment, and treatment plan unless otherwise noted.        Jolee Ewing, MD

## 2018-06-12 NOTE — Anesthesia Pre-Procedure Evaluation
Anesthesia Pre-Procedure Evaluation    Name: Angela Frye      MRN: [id number]     DOB: 27-Jan-1965     Age: 53 y.o.     Sex: female   _________________________________________________________________________     Procedure Date: 06/12/2018   Procedure: Procedure(s):  SIGMOIDOSCOPY DIAGNOSTIC WITH COLLECTION SPECIMEN BY BRUSHING/ WASHING - FLEXIBLE     Physical Assessment  Vital Signs (last filed in past 24 hours):  BP: 109/67 (05/27 0929)  Temp: 36.8 ???C (98.2 ???F) (05/27 0851)  Respirations: 16 PER MINUTE (05/27 0929)  SpO2: 99 % (05/27 0929)  Height: 165.1 cm (65) (05/27 0929)  Weight: 66.6 kg (146 lb 13.2 oz) (05/27 0929)      Patient History  No Known Allergies     Current Medications    Medication Directions   acetaminophen (TYLENOL PO) Take  by mouth.   famotidine (PEPCID PO) Take  by mouth.   MULTIVITAMIN PO Take  by mouth.   other medication 1 Dose. Medication Name & Strength: unknown in blind study for crohns    Dose(how many): unknown    Frequency(how often): unknown         Review of Systems/Medical History      Patient summary reviewed  Nursing notes reviewed  Pertinent labs reviewed    PONV Screening: Female gender and Non-smoker  No history of anesthetic complications  No family history of anesthetic complications        Pulmonary - negative          Cardiovascular - negative        Exercise tolerance: >4 METS      GI/Hepatic/Renal       Inflammatory bowel disease      Bowel prep      Crohn's, rectal bleed, hx colon polyps    S/p bowel resection      Neuro/Psych         Sensory deficit (Blind Left eye)      Musculoskeletal - negative        Endocrine/Other         Anemia   Physical Exam    Airway Findings      Mallampati: I      TM distance: >3 FB      Neck ROM: full      Mouth opening: good      Airway patency: adequate    Dental Findings: Negative      Cardiovascular Findings: Negative      Rhythm: regular      Rate: normal    Pulmonary Findings: Negative      Breath sounds clear to auscultation. Abdominal Findings:         Abdomen soft    Neurological Findings: Negative      Alert and oriented x 3    Constitutional findings:       No acute distress       Diagnostic Tests  Hematology:   Lab Results   Component Value Date    HGB 11.7 06/07/2018    HCT 36.3 06/07/2018    PLTCT 354 06/07/2018    WBC 8.8 06/07/2018    NEUT 63 06/16/2016    ANC 5.30 06/16/2016    ALC 2.30 06/16/2016    MONA 6 06/16/2016    AMC 0.50 06/16/2016    EOSA 2 06/16/2016    ABC 0.10 06/16/2016    MCV 83.0 06/07/2018    MCH 26.7 06/07/2018    MCHC 32.1 06/07/2018  MPV 8.2 06/07/2018    RDW 18.2 06/07/2018         General Chemistry:   Lab Results   Component Value Date    NA 139 09/03/2017    K 4.5 09/03/2017    CL 106 09/03/2017    CO2 28 09/03/2017    GAP 5 09/03/2017    BUN 13 09/03/2017    CR 0.69 09/03/2017    GLU 96 09/03/2017    CA 9.9 09/03/2017    ALBUMIN 4.3 09/03/2017    MG 2.2 12/20/2017    TOTBILI 0.4 09/03/2017      Coagulation: No results found for: PT, PTT, INR      Anesthesia Plan    ASA score: 2   Plan: MAC  Induction method: intravenous  NPO status: acceptable      Informed Consent  Anesthetic plan and risks discussed with patient.        Plan discussed with: CRNA, anesthesiologist and surgeon/proceduralist.

## 2018-06-13 ENCOUNTER — Encounter: Admit: 2018-06-13 | Discharge: 2018-06-13 | Payer: BC Managed Care – PPO

## 2018-06-13 DIAGNOSIS — M21619 Bunion of unspecified foot: ICD-10-CM

## 2018-06-13 DIAGNOSIS — K635 Polyp of colon: ICD-10-CM

## 2018-06-13 DIAGNOSIS — H5440 Blindness, one eye, unspecified eye: Principal | ICD-10-CM

## 2018-06-13 DIAGNOSIS — K5090 Crohn's disease, unspecified, without complications: ICD-10-CM

## 2018-06-13 DIAGNOSIS — D649 Anemia, unspecified: ICD-10-CM

## 2018-06-13 DIAGNOSIS — N200 Calculus of kidney: ICD-10-CM

## 2018-06-14 ENCOUNTER — Encounter: Admit: 2018-06-14 | Discharge: 2018-06-14 | Payer: BC Managed Care – PPO

## 2018-06-14 DIAGNOSIS — K639 Disease of intestine, unspecified: ICD-10-CM

## 2018-06-14 DIAGNOSIS — D508 Other iron deficiency anemias: Principal | ICD-10-CM

## 2018-06-14 DIAGNOSIS — R198 Other specified symptoms and signs involving the digestive system and abdomen: ICD-10-CM

## 2018-06-14 DIAGNOSIS — K50911 Crohn's disease, unspecified, with rectal bleeding: Principal | ICD-10-CM

## 2018-06-14 DIAGNOSIS — K529 Noninfective gastroenteritis and colitis, unspecified: ICD-10-CM

## 2018-06-14 MED ORDER — CYANOCOBALAMIN (VITAMIN B-12) 1,000 MCG/ML IJ KIT
1 mL | PACK | INTRAMUSCULAR | 11 refills | 29.00000 days | Status: AC
Start: 2018-06-14 — End: ?

## 2018-06-14 MED ORDER — FERRIC CARBOXYMALTOSE IVPB
750 mg | Freq: Once | INTRAVENOUS | 0 refills | Status: CN
Start: 2018-06-14 — End: ?

## 2018-06-14 MED ORDER — BD SAFETYGLIDE SYRINGE 3 ML 23 X 1" MISC SYRG
INTRAMUSCULAR | 11 refills | 30.00000 days | Status: AC
Start: 2018-06-14 — End: ?

## 2018-06-14 NOTE — Progress Notes
Notes recorded by Jolee Ewing, MD on 06/12/2018 at 3:12 PM CDT  Low normal b12. 1000 mcg monthly injection.  ---------------------------------------    Notified pt via Mychart.

## 2018-06-15 NOTE — Progress Notes
She has  Iron deficiency anemia, pls order injectafer.

## 2018-06-20 ENCOUNTER — Encounter: Admit: 2018-06-20 | Discharge: 2018-06-20

## 2018-06-24 ENCOUNTER — Encounter: Admit: 2018-06-24 | Discharge: 2018-06-24

## 2018-06-24 DIAGNOSIS — M21619 Bunion of unspecified foot: Secondary | ICD-10-CM

## 2018-06-24 DIAGNOSIS — H5440 Blindness, one eye, unspecified eye: Secondary | ICD-10-CM

## 2018-06-24 DIAGNOSIS — K50819 Crohn's disease of both small and large intestine with unspecified complications: Secondary | ICD-10-CM

## 2018-06-24 DIAGNOSIS — K635 Polyp of colon: Secondary | ICD-10-CM

## 2018-06-24 DIAGNOSIS — D649 Anemia, unspecified: Secondary | ICD-10-CM

## 2018-06-24 DIAGNOSIS — N200 Calculus of kidney: Secondary | ICD-10-CM

## 2018-06-24 DIAGNOSIS — K5090 Crohn's disease, unspecified, without complications: Secondary | ICD-10-CM

## 2018-06-24 DIAGNOSIS — D508 Other iron deficiency anemias: Secondary | ICD-10-CM

## 2018-06-24 MED ORDER — FERRIC CARBOXYMALTOSE IVPB
750 mg | Freq: Once | INTRAVENOUS | 0 refills | Status: CN
Start: 2018-06-24 — End: ?

## 2018-06-24 MED ORDER — FERRIC CARBOXYMALTOSE IVPB
750 mg | Freq: Once | INTRAVENOUS | 0 refills | Status: CP
Start: 2018-06-24 — End: ?
  Administered 2018-06-24 (×2): 750 mg via INTRAVENOUS

## 2018-06-24 NOTE — Patient Instructions
Ferric carboxymaltose injection  Brand Name: Injectafer  What is this medicine?  FERRIC CARBOXYMALTOSE (ferr-ik car-box-ee-mol-toes) is an iron complex. Iron is used to make healthy red blood cells, which carry oxygen and nutrients throughout the body. This medicine is used to treat anemia in people with chronic kidney disease or people who cannot take iron by mouth.  How should I use this medicine?  This medicine is for infusion into a vein. It is given by a health care professional in a hospital or clinic setting.  Talk to your pediatrician regarding the use of this medicine in children. Special care may be needed.  What side effects may I notice from receiving this medicine?  Side effects that you should report to your doctor or health care professional as soon as possible:  allergic reactions like skin rash, itching or hives, swelling of the face, lips, or tongue  dizziness  facial flushing  Side effects that usually do not require medical attention (report to your doctor or health care professional if they continue or are bothersome):  changes in taste  constipation  headache  nausea, vomiting  pain, redness, or irritation at site where injected  What may interact with this medicine?  Do not take this medicine with any of the following medications:  deferoxamine  dimercaprol  other iron products    What if I miss a dose?  It is important not to miss your dose. Call your doctor or health care professional if you are unable to keep an appointment.  Where should I keep my medicine?  This drug is given in a hospital or clinic and will not be stored at home.  What should I tell my health care provider before I take this medicine?  They need to know if you have any of these conditions:  high levels of iron in the blood  liver disease  an unusual or allergic reaction to iron, other medicines, foods, dyes, or preservatives  pregnant or trying to get pregnant  breast-feeding    What should I watch for while using this  medicine?  Visit your doctor or health care professional regularly. Tell your doctor if your symptoms do not start to get better or if they get worse. You may need blood work done while you are taking this medicine.  You may need to follow a special diet. Talk to your doctor. Foods that contain iron include: whole grains/cereals, dried fruits, beans, or peas, leafy green vegetables, and organ meats (liver, kidney).  NOTE:This sheet is a summary. It may not cover all possible information. If you have questions about this medicine, talk to your doctor, pharmacist, or health care provider. Copyright© 2020 [employer]

## 2018-06-25 ENCOUNTER — Ambulatory Visit: Admit: 2018-06-24 | Discharge: 2018-06-25

## 2018-06-25 ENCOUNTER — Encounter: Admit: 2018-06-25 | Discharge: 2018-06-25

## 2018-06-25 DIAGNOSIS — K5000 Crohn's disease of small intestine without complications: Secondary | ICD-10-CM

## 2018-06-25 DIAGNOSIS — D509 Iron deficiency anemia, unspecified: Secondary | ICD-10-CM

## 2018-06-26 ENCOUNTER — Encounter: Admit: 2018-06-26 | Discharge: 2018-06-26

## 2018-06-26 DIAGNOSIS — H5440 Blindness, one eye, unspecified eye: Secondary | ICD-10-CM

## 2018-06-26 DIAGNOSIS — N200 Calculus of kidney: Secondary | ICD-10-CM

## 2018-06-26 DIAGNOSIS — R319 Hematuria, unspecified: Secondary | ICD-10-CM

## 2018-06-26 DIAGNOSIS — K5090 Crohn's disease, unspecified, without complications: Secondary | ICD-10-CM

## 2018-06-26 DIAGNOSIS — D649 Anemia, unspecified: Secondary | ICD-10-CM

## 2018-06-26 DIAGNOSIS — K635 Polyp of colon: Secondary | ICD-10-CM

## 2018-06-26 DIAGNOSIS — N390 Urinary tract infection, site not specified: Principal | ICD-10-CM

## 2018-06-26 DIAGNOSIS — M21619 Bunion of unspecified foot: Secondary | ICD-10-CM

## 2018-06-26 MED ORDER — CIPROFLOXACIN HCL 500 MG PO TAB
500 mg | ORAL_TABLET | Freq: Two times a day (BID) | ORAL | 0 refills | 10.00000 days | Status: AC
Start: 2018-06-26 — End: ?

## 2018-06-26 NOTE — Patient Instructions
Myanna, begin the antibiotic as soon as you can.  Home to rest with plenty of fluids.  If symptoms worsen or change please go to the emergency room  Urine culture is pending results should be back in 2 days

## 2018-06-26 NOTE — Progress Notes
Date of Service: 06/26/2018    Adriane Gabbert is a 53 y.o. female.  DOB: 1965-06-04  MRN: [id number]     Subjective:             History of Present Illness  53 year old with a history of recurrent kidney stone issues, presents hematuria, with some frequency urgency pressure UTI symptoms but her concern always of course is kidney stone.  The pain is not severe, but she points to the left general flank area.  She has had no nausea vomiting fever chills    History of Crohn's disease.  Iron deficiency anemia     Review of Systems   Constitutional: Positive for activity change. Negative for chills and fever.   Cardiovascular: Negative for chest pain.   Gastrointestinal: Negative for abdominal pain, diarrhea and vomiting.   Genitourinary: Positive for hematuria. Negative for menstrual problem and vaginal bleeding.   Allergic/Immunologic: Negative for immunocompromised state.   Neurological: Negative for syncope and light-headedness.   Psychiatric/Behavioral: Negative for agitation and confusion.     No fever chills abdominal pain any colicky pain no radiation from the flank.  No chest pain syncope shortness of breath  Denies pregnancy  Objective:         ??? acetaminophen (TYLENOL PO) Take  by mouth.   ??? ciprofloxacin (CIPRO) 500 mg tablet Take one tablet by mouth twice daily for 7 days.   ??? cyanocobalamin (vitamin B-12) 1,000 mcg/mL kit Inject 1 mL to area(s) as directed every 30 days. Indications: inadequate vitamin B12   ??? famotidine (PEPCID PO) Take  by mouth.   ??? MULTIVITAMIN PO Take  by mouth.   ??? OMEPRAZOLE PO Take  by mouth.   ??? other medication 1 Dose. Medication Name & Strength: unknown in blind study for crohns    Dose(how many): unknown    Frequency(how often): unknown   ??? Syringe with Needle (Disp) (BD SAFETYGLIDE SYRINGE) 3 mL 23 x 1 syrg Inject  into the muscle every 30 days. For B12 injections.     Vitals:    06/26/18 0951   BP: 120/66   BP Source: Arm, Left Upper   Patient Position: Sitting   Pulse: 64 Resp: 16   Temp: 36.8 ???C (98.2 ???F)   TempSrc: Temporal   SpO2: 98%   Weight: 67.5 kg (148 lb 12.8 oz)   Height: 165.1 cm (65)   PainSc: Five     Body mass index is 24.76 kg/m???.     Physical Exam  Constitutional:       General: She is not in acute distress.     Appearance: She is not ill-appearing, toxic-appearing or diaphoretic.   Cardiovascular:      Rate and Rhythm: Normal rate.   Pulmonary:      Effort: Pulmonary effort is normal.   Skin:     General: Skin is warm and dry.      Coloration: Skin is not pale.       No distress.  Color is good.  Respirations unlabored.  Afebrile.  Vitals normal       Assessment and Plan:  Tionna L. Tavares was seen today for urinary urgency, urinary pain and back pain.    Diagnoses and all orders for this visit:    Urinary tract infection with hematuria, site unspecified    History of kidney stones    Other orders  -     ciprofloxacin (CIPRO) 500 mg tablet; Take one tablet by mouth twice daily  for 7 days.    Patient understands beginning antibiotics quickly /  Home, plenty of fluids , but she understands she will go to the emergency room should symptoms change or worsen, and with close follow-up with her primary care.  Dismissed in no  distress

## 2018-06-27 ENCOUNTER — Encounter: Admit: 2018-06-27 | Discharge: 2018-06-27

## 2018-06-27 ENCOUNTER — Ambulatory Visit: Admit: 2018-06-26 | Discharge: 2018-06-27

## 2018-06-27 DIAGNOSIS — Z87442 Personal history of urinary calculi: Secondary | ICD-10-CM

## 2018-06-27 DIAGNOSIS — R319 Hematuria, unspecified: Secondary | ICD-10-CM

## 2018-06-27 DIAGNOSIS — N390 Urinary tract infection, site not specified: Principal | ICD-10-CM

## 2018-06-28 LAB — CULTURE-URINE W/SENSITIVITY
Lab: <10
Lab: <10 — AB

## 2018-07-01 ENCOUNTER — Encounter: Admit: 2018-07-01 | Discharge: 2018-07-01

## 2018-07-01 ENCOUNTER — Ambulatory Visit: Admit: 2018-07-01 | Discharge: 2018-07-02

## 2018-07-01 DIAGNOSIS — D509 Iron deficiency anemia, unspecified: Secondary | ICD-10-CM

## 2018-07-01 DIAGNOSIS — D649 Anemia, unspecified: Secondary | ICD-10-CM

## 2018-07-01 DIAGNOSIS — N200 Calculus of kidney: Secondary | ICD-10-CM

## 2018-07-01 DIAGNOSIS — H5440 Blindness, one eye, unspecified eye: Secondary | ICD-10-CM

## 2018-07-01 DIAGNOSIS — M21619 Bunion of unspecified foot: Secondary | ICD-10-CM

## 2018-07-01 DIAGNOSIS — K635 Polyp of colon: Secondary | ICD-10-CM

## 2018-07-01 DIAGNOSIS — K50819 Crohn's disease of both small and large intestine with unspecified complications: Secondary | ICD-10-CM

## 2018-07-01 DIAGNOSIS — K5090 Crohn's disease, unspecified, without complications: Secondary | ICD-10-CM

## 2018-07-01 MED ORDER — FERRIC CARBOXYMALTOSE IVPB
750 mg | Freq: Once | INTRAVENOUS | 0 refills | Status: CP
Start: 2018-07-01 — End: ?
  Administered 2018-07-01 (×2): 750 mg via INTRAVENOUS

## 2018-07-01 MED ORDER — FERRIC CARBOXYMALTOSE IVPB
750 mg | Freq: Once | INTRAVENOUS | 0 refills | Status: CN
Start: 2018-07-01 — End: ?

## 2018-07-02 ENCOUNTER — Ambulatory Visit: Admit: 2018-07-02 | Discharge: 2018-07-02

## 2018-07-02 ENCOUNTER — Encounter: Admit: 2018-07-02 | Discharge: 2018-07-02

## 2018-07-02 DIAGNOSIS — N200 Calculus of kidney: Secondary | ICD-10-CM

## 2018-07-02 DIAGNOSIS — H5440 Blindness, one eye, unspecified eye: Secondary | ICD-10-CM

## 2018-07-02 DIAGNOSIS — D649 Anemia, unspecified: Secondary | ICD-10-CM

## 2018-07-02 DIAGNOSIS — K5000 Crohn's disease of small intestine without complications: Secondary | ICD-10-CM

## 2018-07-02 DIAGNOSIS — K5090 Crohn's disease, unspecified, without complications: Secondary | ICD-10-CM

## 2018-07-02 DIAGNOSIS — M79671 Pain in right foot: Principal | ICD-10-CM

## 2018-07-02 DIAGNOSIS — K635 Polyp of colon: Secondary | ICD-10-CM

## 2018-07-02 DIAGNOSIS — D508 Other iron deficiency anemias: Secondary | ICD-10-CM

## 2018-07-02 DIAGNOSIS — M21619 Bunion of unspecified foot: Secondary | ICD-10-CM

## 2018-07-02 NOTE — Progress Notes
Date of Service: 07/02/2018      Chief Complaint   Patient presents with   ??? Right Foot - New Patient, Pain      HPI: Patient is a 53 year old female who presents today for evaluation of a 1 year history of right 1st MTP pain. She denies any injury. Over the past few months, her symptoms have worsened. She typically wears tennis shoes or flats. She is unable to walk as much as she used to because she offloads her first column which causes irritation to her lateral foot due to overload. She denies any physical therapy, injections, or NSAIDs.    PHYSICAL EXAM:  There were no vitals filed for this visit.  Pain is rated 7 out of 10    Psychiatric:   ? Normal mood and affect, engaging; alert and oriented  Cardiovascular  ? No pretibial pitting edema  ? Less than two second capillary refill  Respiratory  ? Breathing comfortably     Right lower extremity:  Skin  ? No breaks in the skin, nails intact  Neurologic  ? Sensation grossly intact to light touch, including superficial and deep peroneal nerve, saphenous, sural and tibial distributions  ? Intact tibialis anterior, posterior tibialis tendon, peroneals, Achilles; normal Thompson???s test  Musculoskeletal  ? Hindfoot alignment is slight varus  ? Hindfoot alignment is symmetric, range of motion of the ankle 10/40 and 15 degrees of subtalar motion, including past netural  ? Negative proximal squeeze, no tenderness laterally or medially around the ankle, no pain at the anterior process of the calcaneus, base of the fifth metatarsal, or Lisfranc region  ? TTP dorsal 1st MTP joint, 5 DF/30PF, ROM diminished compared to contralateral side. Palpable dorsal osteophyte. Discomfort with plantar flexion. +1st MTP grind. Full IP joint ROM.    Imaging: 3 view WB right foot xrays obtained and reviewed today in clinic, which do not demonstrate any apparent acute osseus abnormality. 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 From a non-op standpoint, we would recommend optimal shoewear such as a Hoka shoe, carbon fiber inserts, NSAIDs and activity modification. She could also consider corticosteroid injections up to the three total. Patient may follow-up PRN. All questions were answered.       I have personally reviewed the patient intake form with the patient today, it was signed by me and scanned into [facility]. Please see below for details.         Past Medical History:  Medical History:   Diagnosis Date   ??? Anemia    ??? Blind left eye    ??? Bunion    ??? Colon polyps    ??? Crohn's disease (HCC)    ??? Kidney stones        Surgical History:   Procedure Laterality Date   ??? HX BOWEL RESECTION  1995    ileocecal resection   ??? HYDROGEN BREATH TEST - glucose N/A 06/16/2015    Performed by Jolee Ewing, MD at [facility] ENDO   ??? colonoscopy N/A 08/03/2015    Performed by Jolee Ewing, MD at [facility] ENDO   ??? COLONOSCOPY BIOPSY  08/03/2015    Performed by Jolee Ewing, MD at [facility] ENDO   ??? COLONOSCOPY WITH CHROMOENDOSCOPY  08/03/2015    Performed by Jolee Ewing, MD at [facility] ENDO   ??? COLONOSCOPY DIAGNOSTIC  WITH SPECIMEN COLLECTION BY BRUSHING/ WASHING - FLEXIBLE N/A 07/25/2017    Performed by Jolee Ewing, MD at [facility] OR   ??? COLONOSCOPY DIAGNOSTIC WITH SPECIMEN COLLECTION BY BRUSHING/ WASHING - FLEXIBLE N/A 11/07/2017    Performed by Jolee Ewing, MD at [facility] OR   ??? SIGMOIDOSCOPY DIAGNOSTIC WITH COLLECTION SPECIMEN BY BRUSHING/ WASHING - FLEXIBLE N/A 06/12/2018    Performed by Jolee Ewing, MD at [id number] OR   ??? ANUS SURGERY      for tx of rectal ulcer   ??? BREAST REDUCTION     ??? COLON SURGERY     ??? CYSTOURETHROSCOPY     ??? EYE MUSCLE SURGERY     ??? LITHOTRIPSY         Allergies:  Patient has no known allergies.    Current Medications:  ??? acetaminophen (TYLENOL PO) Take  by mouth. ??? ciprofloxacin (CIPRO) 500 mg tablet Take one tablet by mouth twice daily for 7 days.   ??? cyanocobalamin (vitamin B-12) 1,000 mcg/mL kit Inject 1 mL to area(s) as directed every 30 days. Indications: inadequate vitamin B12   ??? MULTIVITAMIN PO Take  by mouth.   ??? OMEPRAZOLE PO Take  by mouth.   ??? other medication 1 Dose. Medication Name & Strength: unknown in blind study for crohns    Dose(how many): unknown    Frequency(how often): unknown   ??? Syringe with Needle (Disp) (BD SAFETYGLIDE SYRINGE) 3 mL 23 x 1 syrg Inject  into the muscle every 30 days. For B12 injections.       Social History:  Social History     Tobacco Use   Smoking Status Former Smoker   Smokeless Tobacco Never Used     Social History     Substance and Sexual Activity   Drug Use No     Social History     Substance and Sexual Activity   Alcohol Use Yes   ??? Alcohol/week: 0.0 standard drinks    Comment: 1-2 drinks per week.       Family History   Problem Relation Age of Onset   ??? Crohn's Disease Father    ??? Cancer-Colon Father         Sign[employer] cell. died at 32   ??? Diabetes Type II Father    ??? Diabetes Type II Mother    ??? Arthritis-rheumatoid Sister    ??? Arthritis-rheumatoid Maternal Grandmother    ??? Diabetes Type II Maternal Grandmother    ??? Crohn's Disease Sister    ??? Ulcerative Colitis Son    ??? Diabetes Type II Paternal Grandmother    ??? Melanoma Neg Hx          General Physical Exam:  General/Constitutional:No apparent distress: well-nourished and well developed.  Eyes: Sclera nonicteric, conjunctiva clear  Respiratory:No shortness of breath or dyspnea  Cardiac: No clubbing, cyanosis, or edema  Vascular: No edema, swelling or tenderness, except as noted in detailed exam.  Integumentary:No impressive skin lesions present, except as noted in detailed exam.  Neuro/Psych: Normal mood and affect, oriented to person, place and time.  Musculoskeletal: Normal, except as noted in detailed exam and in HPI.      Review Of Systems: A 14 point review of systems including HEENT, cardiovascular, pulmonary, gastrointestinal,?genitourinary, psychiatric, neurologic, musculoskeletal, endocrine, and integumentary are negative unless otherwise noted in the history of present illness or on the signed intake form.        Objective:  Vitals:    07/02/18 1303   Weight: 66 kg (145 lb 9.6 oz)   Height: 165.1 cm (65)     Body mass index is 24.23 kg/m???.     ATTESTATION  I personally performed the E/M including history, physical exam, and MDM.    Staff name:  Abran Duke, MD Date:  07/02/2018          Abran Duke, MD    Portions of this noted may have been created using Dragon, a voice recognition software.  Please contact my office for any clarification of documentation    Please send a copy of office notes to the primary care physician and referring providers

## 2018-07-05 ENCOUNTER — Encounter: Admit: 2018-07-05 | Discharge: 2018-07-05

## 2018-07-05 ENCOUNTER — Ambulatory Visit: Admit: 2018-07-05 | Discharge: 2018-07-05

## 2018-07-05 DIAGNOSIS — K639 Disease of intestine, unspecified: Secondary | ICD-10-CM

## 2018-07-05 DIAGNOSIS — K529 Noninfective gastroenteritis and colitis, unspecified: Secondary | ICD-10-CM

## 2018-07-05 DIAGNOSIS — K50919 Crohn's disease, unspecified, with unspecified complications: Secondary | ICD-10-CM

## 2018-07-05 DIAGNOSIS — Z79899 Other long term (current) drug therapy: Secondary | ICD-10-CM

## 2018-07-05 DIAGNOSIS — D899 Disorder involving the immune mechanism, unspecified: Secondary | ICD-10-CM

## 2018-07-05 DIAGNOSIS — Z5181 Encounter for therapeutic drug level monitoring: Secondary | ICD-10-CM

## 2018-07-05 DIAGNOSIS — K50911 Crohn's disease, unspecified, with rectal bleeding: Principal | ICD-10-CM

## 2018-07-05 DIAGNOSIS — R198 Other specified symptoms and signs involving the digestive system and abdomen: Secondary | ICD-10-CM

## 2018-07-05 MED ORDER — CIPROFLOXACIN HCL 500 MG PO TAB
500 mg | ORAL_TABLET | Freq: Two times a day (BID) | ORAL | 0 refills | 10.00000 days | Status: AC
Start: 2018-07-05 — End: ?

## 2018-07-05 MED ORDER — USTEKINUMAB IVPB
390 mg | Freq: Once | INTRAVENOUS | 0 refills | Status: CN
Start: 2018-07-05 — End: ?

## 2018-07-05 MED ORDER — USTEKINUMAB 90 MG/ML SC SYRG
SUBCUTANEOUS | 1 refills | 38.50000 days | Status: DC
Start: 2018-07-05 — End: 2018-07-25

## 2018-07-05 MED ORDER — GADOBENATE DIMEGLUMINE 529 MG/ML (0.1MMOL/0.2ML) IV SOLN
13 mL | Freq: Once | INTRAVENOUS | 0 refills | Status: CP
Start: 2018-07-05 — End: ?
  Administered 2018-07-05: 15:00:00 13 mL via INTRAVENOUS

## 2018-07-05 MED ORDER — BD SAFETYGLIDE SYRINGE 3 ML 23 X 1" MISC SYRG
INTRAMUSCULAR | 3 refills | 30.00000 days | Status: AC
Start: 2018-07-05 — End: ?

## 2018-07-05 MED ORDER — SODIUM CHLORIDE 0.9 % IJ SOLN
50 mL | Freq: Once | INTRAVENOUS | 0 refills | Status: DC
Start: 2018-07-05 — End: 2018-07-10

## 2018-07-05 MED ORDER — METHOTREXATE SODIUM (PF) 25 MG/ML IJ SOLN
25 mg | INTRAMUSCULAR | 1 refills | Status: AC
Start: 2018-07-05 — End: ?

## 2018-07-05 MED ORDER — FOLIC ACID 1 MG PO TAB
5 mg | ORAL_TABLET | Freq: Every day | ORAL | 0 refills | Status: AC
Start: 2018-07-05 — End: ?

## 2018-07-05 MED ORDER — BREEZA FOR NEUTRAL ABDOMINAL/PELVIC IMAGING PO SOLN
1500 mL | Freq: Once | ORAL | 0 refills | Status: DC
Start: 2018-07-05 — End: 2018-07-10

## 2018-07-05 MED ORDER — GLUCAGON HCL 1 MG/ML IJ SOLR
.6 mg | Freq: Once | INTRAVENOUS | 0 refills | Status: CP
Start: 2018-07-05 — End: ?
  Administered 2018-07-05: 14:00:00 0.6 mg via INTRAVENOUS

## 2018-07-05 MED ORDER — GLUCAGON HCL 1 MG/ML IJ SOLR
.4 mg | Freq: Once | SUBCUTANEOUS | 0 refills | Status: CP
Start: 2018-07-05 — End: ?
  Administered 2018-07-05: 13:00:00 0.4 mg via SUBCUTANEOUS

## 2018-07-05 MED ORDER — METRONIDAZOLE 500 MG PO TAB
500 mg | ORAL_TABLET | Freq: Three times a day (TID) | ORAL | 0 refills | Status: AC
Start: 2018-07-05 — End: ?

## 2018-07-05 NOTE — Progress Notes
Discussed the result with her in person today.   Will start Cipro 500 mg BID and Flagyl 500 mg TID for perianal disease (d/w her about side effects including but not limited to avoiding alcohol, neuropathy, achilles tendon rupture, GI upset (to take it with food)).   Plan to start stelara and methotrexate 25 mg weekly injection plus folate 5 mg daily.

## 2018-07-05 NOTE — Telephone Encounter
Brandon Melnick sent to Hermelinda Medicus, RN; Isaiah Blakes, MD            Lucie Leather,   Dr. Caesar Chestnut is next to me seeing this research patient after her [facility] MRI. She is recommending to start on Stelara, as she will come off study medication, ASAP. Plus mTX 25 mg injection weekly and folate 5 mg daily.   Pls start her on Cipro 500 mg BID and flagyl 500 mg TID X 2 weeks. Dr. Caesar Chestnut discussed side effects, including disulfirum effect, GI upset, take thenm with meal, neuropathy, tendon achiles rupture, ...   Thanks,   Olivia and Dr. Caesar Chestnut   Ps. She will call you to confirm this.      Received call from Dr. Caesar Chestnut and confirmed all the above info. Orders placed. Called pt. Pt requested MTX training handout. Documents sent via [place]. Briefly reviewed the above message.  Pt verbalized understanding. Had no further questions or concerns.

## 2018-07-05 NOTE — Progress Notes
The Prior Authorization for Angela Frye was submitted for Angela Frye via [facility].  Will continue to follow.    [place] Patient Advocate  [phone]

## 2018-07-05 NOTE — Progress Notes
Scan:  MRI Enterography Protocol    Order verified: Yes  ?   Allergies reviewed: Yes  ?   Patient history reviewed: denies pheochromocytoma, insulinoma, hypersensitivity to glucagon, or hypersensitivity to lactose (or any other components of the product).    Diabetes: No    Pre procedure Blood Sugar: na       Post procedure Blood Sugar: na     Glucagon administered as ordered: See MAR

## 2018-07-08 ENCOUNTER — Encounter: Admit: 2018-07-08 | Discharge: 2018-07-08

## 2018-07-08 NOTE — Progress Notes
The Prior Authorization for Angela Frye was approved for Angela Frye from 07/05/18 to 07/05/19.      Angela Frye's prescription for Angela Frye is not able to be filled by [facility] due to the patient's prescription insurance.  Pharmacy has contacted Angela Frye to notify them that their prescription will be sent to [facility] as required by their prescription insurance.  The specialty pharmacy team has also notified the ambulatory clinical pharmacist by email.    [place] Patient Advocate  [phone]

## 2018-07-08 NOTE — Progress Notes
See telephone encounter 07/05/18

## 2018-07-10 ENCOUNTER — Ambulatory Visit: Admit: 2018-07-10 | Discharge: 2018-07-11

## 2018-07-10 ENCOUNTER — Encounter: Admit: 2018-07-10 | Discharge: 2018-07-10

## 2018-07-12 ENCOUNTER — Ambulatory Visit: Admit: 2018-07-12 | Discharge: 2018-07-12

## 2018-07-12 ENCOUNTER — Encounter: Admit: 2018-07-12 | Discharge: 2018-07-12

## 2018-07-12 DIAGNOSIS — D72829 Elevated white blood cell count, unspecified: Secondary | ICD-10-CM

## 2018-07-12 DIAGNOSIS — R935 Abnormal findings on diagnostic imaging of other abdominal regions, including retroperitoneum: Secondary | ICD-10-CM

## 2018-07-12 DIAGNOSIS — R197 Diarrhea, unspecified: Secondary | ICD-10-CM

## 2018-07-12 DIAGNOSIS — Z79899 Other long term (current) drug therapy: Secondary | ICD-10-CM

## 2018-07-12 DIAGNOSIS — Z5181 Encounter for therapeutic drug level monitoring: Secondary | ICD-10-CM

## 2018-07-12 DIAGNOSIS — K50919 Crohn's disease, unspecified, with unspecified complications: Principal | ICD-10-CM

## 2018-07-12 DIAGNOSIS — D899 Disorder involving the immune mechanism, unspecified: Secondary | ICD-10-CM

## 2018-07-12 LAB — CBC
Lab: 4.3 M/UL (ref 4.0–5.0)
Lab: 8 10*3/uL (ref 4.5–11.0)

## 2018-07-12 LAB — COMPREHENSIVE METABOLIC PANEL
Lab: 142 MMOL/L (ref 137–147)
Lab: 18 U/L (ref 7–40)
Lab: 20 U/L (ref 7–56)
Lab: 3.6 MMOL/L (ref 3.5–5.1)
Lab: 4.5 g/dL (ref 3.5–5.0)
Lab: 6.9 g/dL (ref 6.0–8.0)
Lab: >60 mL/min (ref >60)
Lab: >60 mL/min (ref >60)

## 2018-07-12 LAB — URINALYSIS, MICROSCOPIC

## 2018-07-12 NOTE — Telephone Encounter
-----   Message from Brandon Melnick sent at 07/12/2018 10:49 AM CDT -----  Ival Bible,     Dr. Caesar Chestnut reviewed lab results for [name] from her research participation - I will send these to you. She is requesting a Pan Culture for this pt due to her high WBC result -ASAP- reflection of her signs and symptoms . Please let us know if you need additional details.    Thank you,   Minette Brine

## 2018-07-12 NOTE — Telephone Encounter
Send lab results to be scanned in pts chart. Spoke with Dr. Caesar Chestnut and was informed to order blood, urine cultures, if coughing obtain sputum culture, if diarrhea order stool culture.     Called pt. Denies fever, chills, is having diarrhea now and recently had UTI. Orders placed for blood, urine, stool cultures as well as infectious stool testing due to diarrhea symptoms. Reviewed elevated WBC w/ pt and reason for additional testing. Pt at [facility] today and will pick up specimen collection containers. Pt had no further questions or concerns.     Routing to Dr. Caesar Chestnut as Juluis Rainier.

## 2018-07-13 LAB — CULTURE-URINE W/SENSITIVITY

## 2018-07-15 NOTE — Progress Notes
Pt notified via MyChart.

## 2018-07-18 LAB — CULTURE-BLOOD W/SENSITIVITY

## 2018-07-25 ENCOUNTER — Encounter: Admit: 2018-07-25 | Discharge: 2018-07-25

## 2018-07-25 ENCOUNTER — Ambulatory Visit: Admit: 2018-07-25 | Discharge: 2018-07-26

## 2018-07-25 DIAGNOSIS — D649 Anemia, unspecified: Secondary | ICD-10-CM

## 2018-07-25 DIAGNOSIS — K50819 Crohn's disease of both small and large intestine with unspecified complications: Secondary | ICD-10-CM

## 2018-07-25 DIAGNOSIS — K5090 Crohn's disease, unspecified, without complications: Secondary | ICD-10-CM

## 2018-07-25 DIAGNOSIS — M21619 Bunion of unspecified foot: Secondary | ICD-10-CM

## 2018-07-25 DIAGNOSIS — H5440 Blindness, one eye, unspecified eye: Secondary | ICD-10-CM

## 2018-07-25 DIAGNOSIS — K635 Polyp of colon: Secondary | ICD-10-CM

## 2018-07-25 DIAGNOSIS — N200 Calculus of kidney: Secondary | ICD-10-CM

## 2018-07-25 MED ORDER — USTEKINUMAB IVPB
390 mg | Freq: Once | INTRAVENOUS | 0 refills | Status: CP
Start: 2018-07-25 — End: ?
  Administered 2018-07-25 (×2): 390 mg via INTRAVENOUS

## 2018-07-25 MED ORDER — USTEKINUMAB IVPB
390 mg | Freq: Once | INTRAVENOUS | 0 refills | Status: CN
Start: 2018-07-25 — End: ?

## 2018-07-25 MED ORDER — USTEKINUMAB 90 MG/ML SC SYRG
SUBCUTANEOUS | 1 refills | 38.50000 days | Status: DC
Start: 2018-07-25 — End: 2018-08-26

## 2018-07-25 NOTE — Progress Notes
Angela Frye's specialty medication Angela Frye has been approved but is mandated to [place] by their insurance. A new prescription has been sent and this message will be routed to provider as notification.    Of note, this was approved 2 weeks ago but was delayed in sending the prescription out until approval on the infusion.    Dorothe Pea, PHARMD

## 2018-07-25 NOTE — Telephone Encounter
07/24/18-Sent message to Pre-Cert to request urgent pre-cert for Stelara. Received reply email stating its been approved but to to full schedule, not able to infuse pt until 08/12/18. Notified Dr. Caesar Chestnut. Was informed if [facility] infusion cannot infuse pt sooner than 08/12/18, infusion will have to be sent to outside infusion site for ASAP infusion.  07/25/18-Was informed by Infusion Scheduling they had a cancellation and was able to schedule pt today at 2 pm. Notified Dr. Caesar Chestnut.

## 2018-07-25 NOTE — Patient Instructions
Ustekinumab injection  Brand Name: Marcy Panning  What is this medicine?  USTEKINUMAB ([place] te KIN ue mab) is used to treat plaque psoriasis and psoriatic arthritis. It is also used to treat Crohn's disease and ulcerative colitis. It is not a cure.  How should I use this medicine?  This medicine is for injection under the skin or infusion into a vein. It is usually given by a health care professional in a hospital or clinic setting. If you get this medicine at home, you will be taught how to prepare and give this medicine. Use exactly as directed. Take your medicine at regular intervals. Do not take your medicine more often than directed.  It is important that you put your used needles and syringes in a special sharps container. Do not put them in a trash can. If you do not have a sharps container, call your pharmacist or healthcare provider to get one.  A special MedGuide will be given to you by the pharmacist with each prescription and refill. Be sure to read this information carefully each time.  Talk to your pediatrician regarding the use of this medicine in children. While this drug may be prescribed for children as young as 12 years for selected conditions, precautions do apply.  What side effects may I notice from receiving this medicine?  Side effects that you should report to your doctor or health care professional as soon as possible:  ??? allergic reactions like skin rash, itching or hives, swelling of the face, lips, or tongue  ??? breathing problems  ??? changes in vision  ??? confusion  ??? persistent headache  ??? seizures  ??? signs and symptoms of infection like fever or chills; cough; sore throat; pain or trouble passing urine  ??? swollen lymph nodes in the neck, underarm, or groin areas  ??? unexplained weight loss  ??? unusually weak or tired  Side effects that usually do not require medical attention (report to your doctor or health care professional if they continue or are bothersome):  ??? diarrhea  ??? headache ??? nausea, vomiting  ??? redness, itching, swelling, or bruising at site where injected  ??? stomach pain  ??? tiredness  What may interact with this medicine?  Do not take this medicine with any of the following medications:  ??? live virus vaccines  This medicine may also interact with the following medications:  ??? cyclosporine  ??? biologic medicines such as abatacept, adalimumab, anakinra, certolizumab, etanercept, golimumab, infliximab, rituximab, secukinumab, tocilizumab  ??? warfarin  What if I miss a dose?  If you miss a dose, take it as soon as you can. If it is almost time for your next dose, take only that dose. Do not take double or extra doses.  Where should I keep my medicine?  Keep out of the reach of children.  If you are using this medicine at home, you will be instructed on how to store this medicine. Store the prefilled syringes or unopened vials in a refrigerator between 2 to 8 degrees C (36 to 46 degrees F). The vials should be stored upright. Keep in the original carton. Protect from light. Do not freeze. Do not shake. Throw away any unused medicine after the expiration date on the label.  What should I tell my health care provider before I take this medicine?  They need to know if you have any of these conditions:  ??? cancer  ??? diabetes  ??? history of skin cancer  ??? immune system problems  ???  infection (especially a virus infection such as chickenpox, cold sores, or herpes) or history of infections  ??? new or changing lesions on your skin  ??? receiving or have received allergy shots  ??? receive or have received phototherapy for the skin  ??? recently received or scheduled to receive a vaccine  ??? tuberculosis, a positive skin test for tuberculosis, or have recently been in close contact with someone who has tuberculosis  ??? an unusual reaction to ustekinumab, latex, other medicines, foods, dyes, or preservatives  ??? pregnant or trying to get pregnant  ??? breast-feeding What should I watch for while using this medicine?  Your condition will be monitored carefully while you are receiving this medicine. Tell your doctor or healthcare professional if your symptoms do not start to get better or if they get worse.  You will be tested for tuberculosis (TB) before you start this medicine. If your doctor prescribes any medicine for TB, you should start taking the TB medicine before starting this medicine. Make sure to finish the full course of TB medicine.  Call your doctor or health care professional if you get a cold or other infection while receiving this medicine. Do not treat yourself. This medicine may decrease your body's ability to fight infection.  Talk to your doctor about your risk of cancer. You may be more at risk for certain types of cancers if you take this medicine.  NOTE:This sheet is a summary. It may not cover all possible information. If you have questions about this medicine, talk to your doctor, pharmacist, or health care provider. Copyright??? 2020 Elsevier

## 2018-07-25 NOTE — Progress Notes
Pharmacy Initial Medication Assessment: Interleukin 12 and 23 Inhibitor    Indication / Regimen   Stelara (ustekinumab) is being used for the appropriate indication of treatment for moderate to severe Crohn's disease in adults who have failed or were intolerant to immunomodulatory or corticosteroids therapy, but never failed tumor necrosis factor (TNF) blocker therapy or who have failed or were intolerant to treatment with one or more TNF blockers.     The regimen of 90 mg subcutaneously every 8 weeks, to start 8 weeks after intravenous (IV) induction dose has been completed is planned to continue indefinitely which is appropriate for [name]. The patient has received the IV induction dose of 390 mg on 07/25/18. No renal or hepatic adjustments are required.      At this time there is not additional dose titration other than that listed above.     The patient has the ability to self-administer the medication.     Therapeutic Goals  Angela Frye has the following therapeutic goals: Clinical Remission, Histologic Remission and GI Symptom Improvement    Baseline Characteristics    Level of disease activity / description of disease: worsening ileocolonic Crohn's disease   Current symptoms: Unknown as there is limited information in the chart due to her recent status as a study patient   Medications being used concurrently: Methotrexate   Previously trialed agents: Remicade, Humira, Entyvio and Azathioprine   Allergy and/or intolerance to previous medications: No    Co-morbid conditions: IDA   Additional considerations: Recently on unknown study drug   Labs:  Wt Readings from Last 1 Encounters:   07/25/18 65.5 kg (144 lb 6.4 oz)     T Spot TB   Date Value Ref Range Status   09/10/2015   Final    Negative  The test result is Negative because the spot count in (Panel A minus  Nil Control) and (Panel B minus Nil Control) is less than or equal to  4. This includes values less than zero.X0d0aNote: Diagnosing or excluding tuberculosis disease, and assessing the probability of  LTBI, requires a combination of epidemiological, historical, medical  and diagnostic findings that should be taken into account when  interpreting T-SPOT.TB test results.  Refer to the most recent CDC  guidance (WealthyGadgets.at.htm)  for detailed recommendations on diagnosing TB infection (including  disease) and selecting persons for testing.  Guidelines set forth by  the Centers of Disease Control and Prevention (CDC) recommend  contacts of a person with tuberculosis (TB) disease who have a  negative initial interferon-gamma release assay (IGRA) or TST within  8 weeks of exposure be retested 8 - 10 weeks after last exposure.  X0d0a       HBsAg   Date/Time Value Ref Range Status   08/30/2015 02:31 PM NEG  Final     Anti HBs   Date/Time Value Ref Range Status   08/30/2015 02:31 PM <2.5 mIU/ml Final     Comment:                           Hepatitis B Surface Antibody Reference Ranges                        >12.0     Positive  8.0-12.0  Equivocal  <8.0      Negative        Lab Results   Component Value Date/Time    RBC 4.38  07/12/2018 12:03 PM    HGB 12.5 07/12/2018 12:03 PM    HCT 38.6 07/12/2018 12:03 PM    MCV 88.1 07/12/2018 12:03 PM    MCH 28.6 07/12/2018 12:03 PM    MCHC 32.5 07/12/2018 12:03 PM    RDW 23.9 (H) 07/12/2018 12:03 PM    PLTCT 312 07/12/2018 12:03 PM    MPV 7.9 07/12/2018 12:03 PM       Latent Infection Assessment: As hepatitis B and tuberculosis labs have been completed, the patient will be educated at earliest convenience     Allergies  No Known Allergies    Past Medical History  Medical History:   Diagnosis Date   ??? Anemia    ??? Blind left eye    ??? Bunion    ??? Colon polyps    ??? Crohn's disease (HCC)    ??? Kidney stones        Immunization Status  There is no immunization history for the selected administration types on file for this patient. Vaccine history was reviewed with the patient. The patient was reminded about the importance of receiving an annual influenza vaccine as indicated.     Pregnancy Status  Pregnancy status was assessed and determined to be: Female, not of child-bearing potential: education not applicable.     Medication Reconciliation  Medication reconciliation is based on the patient???s most recent med list in electronic medical record (EMR) including herbal products and OTC medications. The patients??? medication list will be updated during patient education, after speaking with the patient and prior to dispensing the medication.     Home Medications    Medication Sig   acetaminophen (TYLENOL PO) Take  by mouth.   cyanocobalamin (vitamin B-12) 1,000 mcg/mL kit Inject 1 mL to area(s) as directed every 30 days. Indications: inadequate vitamin B12   folic acid (FOLVITE) 1 mg tablet Take five tablets by mouth daily.   methotrexate PF 25 mg/mL injection Inject 1 mL into the muscle every 7 days. Indications: Crohn's disease   MULTIVITAMIN PO Take  by mouth.   OMEPRAZOLE PO Take  by mouth.   other medication 1 Dose. Medication Name & Strength: unknown in blind study for crohns    Dose(how many): unknown    Frequency(how often): unknown   Syringe with Needle (Disp) (BD SAFETYGLIDE SYRINGE) 3 mL 23 x 1 syrg Inject  into the muscle every 7 days. For Methotrexate Injections   Syringe with Needle (Disp) (BD SAFETYGLIDE SYRINGE) 3 mL 23 x 1 syrg Inject  into the muscle every 30 days. For B12 injections.   ustekinumab (STELARA) 90 mg syringe Injection 1 ml under skin 8 weeks after Stelara Infusion       Drug-Drug Interactions  No new significant drug-drug or drug-food interactions were identified.    Drug-Food Interactions  There are no significant drug-food interactions. This medication can be taken with or without food.     Contraindications  Angela Frye has no contraindications to this medication.    Safety Precautions Safety precautions were evaluated and will be discussed with patient as applicable.     Risk Evaluation and Mitigation Strategy (REMS) Assessment  No REMS program is required for this medication.    Follow-up Plan   An initial medication assessment has been completed and the patient will be contacted for education.    Mliss Fritz, PHARMD

## 2018-07-25 NOTE — Progress Notes
Patient tolerated infusion well, VSS.   Discharged to home.

## 2018-07-25 NOTE — Telephone Encounter
-----   Message from Brandon Melnick sent at 07/24/2018 11:12 AM CDT -----  Regarding: Pt followup call  I reached out to [place] on 7/6, 7/7, she answered 7/8. Pt relayed having no symptoms of UTI, but feeling very tired and having very loose stools. Inquired with pt if she started Stelara, she indicated not hearing back as of 7/8 if it were approved or if there is program to help pay for it. I relayed a note in her chart for approval from 07/05/18 to 07/05/19 and that prescription was sent to [facility] and not outpatient hospital pharmacy. She going to call [place]. Elmo Putt, she also might reach out to you with any questions.     Thank you,   Minette Brine    ----- Message -----  From: Isaiah Blakes, MD  Sent: 07/17/2018   7:41 AM CDT  To: Brandon Melnick    Pls check how she's doing and document. thx  ----- Message -----  From: Interface, In Results Misys  Sent: 07/12/2018  12:41 PM CDT  To: Isaiah Blakes, MD

## 2018-08-02 ENCOUNTER — Encounter: Admit: 2018-08-02 | Discharge: 2018-08-02

## 2018-08-02 ENCOUNTER — Ambulatory Visit: Admit: 2018-08-02 | Discharge: 2018-08-03

## 2018-08-02 NOTE — Telephone Encounter
Received call from pt stating she received a letter from [facility] stating [place] approved and they cannot ship it out. Pt also inquiring if she can be signed up for Stelara savings program. On Chart review, Stelara infusion was with [facility] on 07/25/18 (authorized by insurance). Stelara PA was approved but insurance but mandated script to be dispensed by [facility]. Informed pt I will send an email to our GI pharmacy team help straighten this out and ask for assistance on Stelara savings program.    Email sent. Was informed [facility] Pharmac[facility] will [facility] and pt.

## 2018-08-02 NOTE — Telephone Encounter
Called [facility] to discuss problems in filling [place] injection.     Per [facility] there is no authorization on file. Contacted insurance, [facility], where representative stated that there is a current authorization on file from 07/05/18-07/05/19. However, the first month of the authorization is for the infusion and the injection authorization starts tomorrow, 7/18.     Called [place] and relayed the above information. She will plan con calling [facility] sometime after tomorrow to arrange the file. Patient did have questions regarding upcoming move to [place]. Advised patient to fill Stelara prior to move at the end of August since first injection is due 09/19/18. Patient verbalized understanding.     Will plan to follow up with patient in about 1 month to reassess Stelara and discuss virtual injection education.     Dorothe Pea, PHARMD

## 2018-08-22 ENCOUNTER — Encounter: Admit: 2018-08-22 | Discharge: 2018-08-22

## 2018-08-22 DIAGNOSIS — N200 Calculus of kidney: Secondary | ICD-10-CM

## 2018-08-22 NOTE — Telephone Encounter
Attempted to call Angela Frye to verify compliance and assess tolerance of her specialty medications (Stelara). No answer. Left voicemail asking patient to return call to pharmacist at [phone].    Dorothe Pea, PHARMD

## 2018-08-26 ENCOUNTER — Encounter: Admit: 2018-08-26 | Discharge: 2018-08-26

## 2018-08-26 MED ORDER — USTEKINUMAB 90 MG/ML SC SYRG
90 mg | SUBCUTANEOUS | 1 refills | 38.50000 days | Status: DC
Start: 2018-08-26 — End: 2018-12-26

## 2018-08-26 MED ORDER — USTEKINUMAB 90 MG/ML SC SYRG
90 mg | SUBCUTANEOUS | 1 refills | 38.50000 days | Status: DC
Start: 2018-08-26 — End: 2018-08-26

## 2018-08-26 NOTE — Progress Notes
Angela Frye's specialty medication Angela Frye has been approved but is mandated to [employer]) Psychologist, prison and probation services by their insurance. A new prescription has been sent and this message will be routed to provider as notification.    Dorothe Pea, PHARMD

## 2018-08-26 NOTE — Progress Notes
The Prior Authorization for Angela Frye was approved for Angela Frye from 08/26/18 to 08/26/19.      Angela Frye's prescription for Angela Frye is not able to be filled by [facility] due to the patient's prescription insurance.  Pharmacy has contacted Angela Mood Tutor to notify them that their prescription will be sent to [facility] as required by their prescription insurance.  The specialty pharmacy team has also notified the ambulatory clinical pharmacist by email.    [place] Patient Advocate  [phone]

## 2018-08-26 NOTE — Progress Notes
Per message from patient, she has new insurance. Stelara prescription ordered and sent to [facility] to assist with PA. Will route to provider for cosignature.     Dorothe Pea, PHARMD

## 2018-08-27 ENCOUNTER — Encounter: Admit: 2018-08-27 | Discharge: 2018-08-27

## 2018-08-27 NOTE — Telephone Encounter
Attempted to call Angela Frye to verify compliance and assess tolerance of her specialty medications (Stelara (ustekinumab)). No answer. Left voicemail asking patient to return call to pharmacist at [phone].    Virgilio Belling, PHARMD

## 2018-08-27 NOTE — Progress Notes
Received VM from [facility] stating they are not in network with pt. Script must go to [facility].    Routing to GI Pharmacist to assist with clarification.

## 2018-08-29 ENCOUNTER — Encounter: Admit: 2018-08-29 | Discharge: 2018-08-29

## 2018-08-29 NOTE — Progress Notes
Received fax from pt's pharmacy [facility] requesting clarification on the frequency of Stelara prescription. Per medication order, confirmed pt is to inject 43mL under skin every 8 weeks after Stelara infusion on 07/25/18. Faxed back to [phone] with successful result.

## 2018-08-30 ENCOUNTER — Encounter: Admit: 2018-08-30 | Discharge: 2018-08-30

## 2018-08-30 NOTE — Telephone Encounter
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     The regimen of 90 mg subcutaneously every 8 weeks is planned to continue indefinitely which is appropriate for [name]. The patient last received the IV induction dose of 390 mg on 07/25/18. No renal or hepatic adjustments are required.      At this time there is not additional dose titration other than that listed above.       Therapeutic Goals and Response to Therapy  Patient assessments:   Subjective Quality of Life Measurement: Angela Frye was able to complete all normal daily activities over the past 30 days.     Injection related concerns: NA   Current signs of infection: Denies   Steroid course in the past 30 days: None   Changes in clinical symptoms: Reporting some improvement in energy, less urgency   Endoscopic remission: No - flex sig 06/12/18: chronic active colitis with acute cryptitis, crypt abscess, and architectural distortion   Histologic remission: No    T Spot TB   Date Value Ref Range Status   09/10/2015   Final    Negative  The test result is Negative because the spot count in (Panel A minus  Nil Control) and (Panel B minus Nil Control) is less than or equal to  4. This includes values less than zero.[id number]: Diagnosing or  excluding tuberculosis disease, and assessing the probability of  LTBI, requires a combination of epidemiological, historical, medical  and diagnostic findings that should be taken into account when  interpreting T-SPOT.TB test results.  Refer to the most recent CDC guidance (WealthyGadgets.at.htm)  for detailed recommendations on diagnosing TB infection (including  disease) and selecting persons for testing.  Guidelines set forth by  the Centers of Disease Control and Prevention (CDC) recommend  contacts of a person with tuberculosis (TB) disease who have a  negative initial interferon-gamma release assay (IGRA) or TST within  8 weeks of exposure be retested 8 - 10 weeks after last exposure.  X0d0a       HBsAg   Date/Time Value Ref Range Status   08/30/2015 02:31 PM NEG  Final     Anti HBs   Date/Time Value Ref Range Status   08/30/2015 02:31 PM <2.5 mIU/ml Final     Comment:                           Hepatitis B Surface Antibody Reference Ranges                        >12.0     Positive  8.0-12.0  Equivocal  <8.0      Negative        Lab Results   Component Value Date/Time    RBC 4.38 07/12/2018 12:03 PM    HGB 12.5 07/12/2018 12:03 PM    HCT 38.6 07/12/2018 12:03 PM    MCV 88.1 07/12/2018 12:03 PM    MCH 28.6 07/12/2018 12:03 PM    MCHC 32.5 07/12/2018 12:03 PM    RDW 23.9 (H) 07/12/2018 12:03 PM  PLTCT 312 07/12/2018 12:03 PM    MPV 7.9 07/12/2018 12:03 PM       As the patient is achieving therapeutic benefit, the plan is to continue current therapy     Adverse Effects  [name] is not experiencing any significant adverse effects to this medication regimen.    Adherence  Refill and adherence history were reviewed with the patient. The patient is adherent with refills and is meeting their refill goal. They report no missed doses over the past 30 days.  The patient is meeting their adherence goal. The patient was reminded about the refill process and re-educated on the importance of adherence.  [name] is adherent with medication refills.    Allergies   No Known Allergies     Medication Reconciliation  A medication history and reconciliation were performed (including prescription medications, supplements, over the counter, and herbal products). The medication list was updated and the patient's current medication list is included below.     Home Medications    Medication Sig   acetaminophen (TYLENOL PO) Take  by mouth.   cyanocobalamin (vitamin B-12) 1,000 mcg/mL kit Inject 1 mL to area(s) as directed every 30 days. Indications: inadequate vitamin B12   folic acid (FOLVITE) 1 mg tablet Take five tablets by mouth daily.   methotrexate PF 25 mg/mL injection Inject 1 mL into the muscle every 7 days. Indications: Crohn's disease   MULTIVITAMIN PO Take  by mouth.   OMEPRAZOLE PO Take  by mouth.   other medication 1 Dose. Medication Name & Strength: unknown in blind study for crohns    Dose(how many): unknown    Frequency(how often): unknown   Syringe with Needle (Disp) (BD SAFETYGLIDE SYRINGE) 3 mL 23 x 1 syrg Inject  into the muscle every 7 days. For Methotrexate Injections   Syringe with Needle (Disp) (BD SAFETYGLIDE SYRINGE) 3 mL 23 x 1 syrg Inject  into the muscle every 30 days. For B12 injections.   ustekinumab (STELARA) 90 mg syringe Inject 1 mL under the skin every 8 weeks after Stelara infusion.        Drug-drug and drug-food interactions between the patient's specialty medication and their medication list were assessed and reviewed with the patient. The patient was instructed to speak with their health care provider before starting any new drugs, including prescription or over the counter, natural / herbal products, or vitamins.    No new significant drug-drug or drug-food interactions were identified.    Their regimen can be taken with or without food.    Immunization Status  There is no immunization history for the selected administration types on file for this patient.  Vaccine history was reviewed with the patient. The patient was reminded about the importance of receiving an annual influenza vaccine as indicated.     Pregnancy Status Pregnancy status was assessed and determined to be: Female, not of child-bearing potential: education not applicable.     Risk Evaluation and Mitigation Strategy (REMS) Assessment  No REMS program is required for this medication.     Follow-up Plan   [name] was given the opportunity to ask questions and did not have any questions at this time. The patient was encouraged to call with questions. The patient will be contacted to complete another reassessment within one month following first injection.     Benedetto Coons, PHARMD

## 2018-09-12 ENCOUNTER — Encounter: Admit: 2018-09-12 | Discharge: 2018-09-12

## 2018-09-12 NOTE — Telephone Encounter
Gastroenterology Specialty Medication Counseling     Maya Arcand [facility] was contacted via telephone to provide medication education on their new specialty medication.    Tyne Banta Brandel was educated on the medication name Stelara (ustekinumab) along with the medication class ([place] 12/23 inhibitors). The indication for treatment, dose, route, frequency and duration were discussed with the patient. The patient was educated on timely administration of therapy and management of missed doses. Adherence with therapy was discussed with the patient. The patient's ability to be adherent with drug therapies was discussed and the patient was provided options for tools/resources that promote adherence to therapy.     The patient's ability to self-administer medication was assessed. The patient was educated on proper administration/injection technique for their therapy and verbalized acceptance and understanding. Appropriate safe-handling, storage and disposal directions were reviewed with the patient.     Contraindications to therapy, safety precautions, and common side effects were discussed with the patient. Sun precautions related to their medication were also discussed with the patient. They were instructed to seek medical attention immediately if they experience signs of an allergic reaction, including but not limited to: a rash; hives; itching; red, swollen, blistered, or peeling skin with or without fever.      Requirements of the REMS program were discussed with the patient as applicable.     A medication history and reconciliation was performed (including prescription medications, supplements, over the counter, and herbal products). The medication list was updated and the patient's current medication list is listed below.     Home Medications    Medication Sig   acetaminophen (TYLENOL PO) Take  by mouth.   cyanocobalamin (vitamin B-12) 1,000 mcg/mL kit Inject 1 mL to area(s) as directed every 30 days. Indications: inadequate vitamin B12   folic acid (FOLVITE) 1 mg tablet Take five tablets by mouth daily.   methotrexate PF 25 mg/mL injection Inject 1 mL into the muscle every 7 days. Indications: Crohn's disease   MULTIVITAMIN PO Take  by mouth.   OMEPRAZOLE PO Take  by mouth.   other medication 1 Dose. Medication Name & Strength: unknown in blind study for crohns    Dose(how many): unknown    Frequency(how often): unknown   Syringe with Needle (Disp) (BD SAFETYGLIDE SYRINGE) 3 mL 23 x 1 syrg Inject  into the muscle every 7 days. For Methotrexate Injections   Syringe with Needle (Disp) (BD SAFETYGLIDE SYRINGE) 3 mL 23 x 1 syrg Inject  into the muscle every 30 days. For B12 injections.   ustekinumab (STELARA) 90 mg syringe Inject 1 mL under the skin every 8 weeks after Stelara infusion.        Drug-drug and drug-food interactions with the new therapy were assessed and reviewed with the patient. The patient was instructed to speak with their health care provider before starting any new drug, including prescription or over the counter, natural products, or vitamins.    Pregnancy potential was reviewed with the patient. Female, not of child-bearing potential: education not applicable.     Appropriate recommended vaccinations were reviewed and discussed with the patient.    The monitoring and follow-up plan was discussed with the patient. The patient was instructed to contact their health care provider if their symptoms or health problems become worse. Cheral Cappucci Staheli was instructed to contact the GI pharmacist at [phone] if they have any questions or concerns regarding their medication therapy.     The patient was notified that their medication will be filled  by [facility], an outside specialty pharmacy mandated by insurance. A prescription has been/will be sent to the outside specialty pharmacy. The patient was provided the outside specialty phone number, and advised to schedule delivery of their medication. The patient was educated on the refill process. As the patient is not receiving their medication from [facility] specialty pharmacy, they were instructed to discuss the refill process with the outside pharmacy when they coordinate the initial fill. The patient was educated on the importance of adherence and to remain compliant with the refill process.      Nelda Siebold Caillier was given the opportunity to ask questions and did not have any questions at this time.    Benedetto Coons, PHARMD

## 2018-10-14 NOTE — Telephone Encounter
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     The regimen of 90 mg subcutaneously every 8 weeks is planned to continue indefinitely which is appropriate for [name]. The patient last received the IV induction dose of 390 mg on 07/25/18. No renal or hepatic adjustments are required.      At this time there is not additional dose titration other than that listed above.       Therapeutic Goals and Response to Therapy  Patient assessments:  Subjective clinical assessment: on a scale of 1 to 10, the patient rates they are feeling 8 out of 10 while on treatment.      Injection related concerns: Denies   Current signs of infection: Denies   Steroid course in the past 30 days: Denies   Changes in clinical symptoms: 4-5 loose stools/day. No longer reporting blood in stool   Endoscopic remission: No - flex sig 06/12/18: chronic active colitis with acute cryptitis, crypt abscess, and architectural distortion   Histologic remission: No    T Spot TB   Date Value Ref Range Status   09/10/2015   Final    Negative  The test result is Negative because the spot count in (Panel A minus  Nil Control) and (Panel B minus Nil Control) is less than or equal to  4. This includes values less than zero.[id number]: Diagnosing or  excluding tuberculosis disease, and assessing the probability of  LTBI, requires a combination of epidemiological, historical, medical  and diagnostic findings that should be taken into account when  interpreting T-SPOT.TB test results.  Refer to the most recent CDC guidance (WealthyGadgets.at.htm)  for detailed recommendations on diagnosing TB infection (including  disease) and selecting persons for testing.  Guidelines set forth by  the Centers of Disease Control and Prevention (CDC) recommend  contacts of a person with tuberculosis (TB) disease who have a  negative initial interferon-gamma release assay (IGRA) or TST within  8 weeks of exposure be retested 8 - 10 weeks after last exposure.  X0d0a       HBsAg   Date/Time Value Ref Range Status   08/30/2015 02:31 PM NEG  Final     Anti HBs   Date/Time Value Ref Range Status   08/30/2015 02:31 PM <2.5 mIU/ml Final     Comment:                           Hepatitis B Surface Antibody Reference Ranges                        >12.0     Positive  8.0-12.0  Equivocal  <8.0      Negative        Lab Results   Component Value Date/Time    RBC 4.38 07/12/2018 12:03 PM    HGB 12.5 07/12/2018 12:03 PM    HCT 38.6 07/12/2018 12:03 PM    MCV 88.1 07/12/2018 12:03 PM    MCH 28.6 07/12/2018 12:03 PM    MCHC 32.5 07/12/2018 12:03 PM    RDW 23.9 (  H) 07/12/2018 12:03 PM    PLTCT 312 07/12/2018 12:03 PM    MPV 7.9 07/12/2018 12:03 PM       As the patient is achieving therapeutic benefit, the plan is to continue current therapy     Adverse Effects  Katharine Rochefort is not experiencing any significant adverse effects to this medication regimen.    Adherence  Refill and adherence history were reviewed with the patient. The patient is adherent with refills and is meeting their refill goal. They report no missed doses over the past 30 days.  The patient is meeting their adherence goal. The patient was reminded about the refill process and re-educated on the importance of adherence.  Cashe Gatt Mckinlay is adherent with medication refills.    Allergies   No Known Allergies     Medication Reconciliation  A medication history and reconciliation were performed (including prescription medications, supplements, over the counter, and herbal products). The medication list was updated and the patient's current medication list is included below.     Home Medications    Medication Sig   acetaminophen (TYLENOL PO) Take  by mouth.   cyanocobalamin (vitamin B-12) 1,000 mcg/mL kit Inject 1 mL to area(s) as directed every 30 days. Indications: inadequate vitamin B12   folic acid (FOLVITE) 1 mg tablet Take five tablets by mouth daily.   methotrexate PF 25 mg/mL injection Inject 1 mL into the muscle every 7 days. Indications: Crohn's disease   MULTIVITAMIN PO Take  by mouth.   OMEPRAZOLE PO Take  by mouth.   other medication 1 Dose. Medication Name & Strength: unknown in blind study for crohns    Dose(how many): unknown    Frequency(how often): unknown   Syringe with Needle (Disp) (BD SAFETYGLIDE SYRINGE) 3 mL 23 x 1 syrg Inject  into the muscle every 7 days. For Methotrexate Injections   Syringe with Needle (Disp) (BD SAFETYGLIDE SYRINGE) 3 mL 23 x 1 syrg Inject  into the muscle every 30 days. For B12 injections.   ustekinumab (STELARA) 90 mg syringe Inject 1 mL under the skin every 8 weeks after Stelara infusion.        Drug-drug and drug-food interactions between the patient's specialty medication and their medication list were assessed and reviewed with the patient. The patient was instructed to speak with their health care provider before starting any new drugs, including prescription or over the counter, natural / herbal products, or vitamins.    No new significant drug-drug or drug-food interactions were identified.    Their regimen can be taken with or without food.    Immunization Status  There is no immunization history for the selected administration types on file for this patient.  Vaccine history was reviewed with the patient. The patient was reminded about the importance of receiving an annual influenza vaccine as indicated.     Pregnancy Status Pregnancy status was assessed and determined to be: Female, not of child-bearing potential: education not applicable.     Risk Evaluation and Mitigation Strategy (REMS) Assessment  No REMS program is required for this medication.     Follow-up Plan   Makaiyah Schweiger Proud was given the opportunity to ask questions and did not have any questions at this time. The patient was encouraged to call with questions. The patient will be contacted to complete another reassessment within 1 year.     Benedetto Coons, PHARMD

## 2018-12-26 ENCOUNTER — Encounter: Admit: 2018-12-26 | Discharge: 2018-12-26 | Payer: BC Managed Care – PPO

## 2018-12-26 MED ORDER — USTEKINUMAB 90 MG/ML SC SYRG
90 mg | SUBCUTANEOUS | 1 refills | 38.50000 days | Status: AC
Start: 2018-12-26 — End: ?

## 2018-12-26 NOTE — Telephone Encounter
Attempted to call pt. LVM stating refill was approved. Left office number to call back if any questions or concerns.

## 2018-12-26 NOTE — Telephone Encounter
Pt called and requested refill on Stelara 90 mg SQ q 8 weeks. Dispense 1 pen w/ 1 refill. Pt does not have labs on file since June. Pt stated she moved to [facility] and was hoping to keep Dr. Caesar Chestnut until she found a new GI provider. Pt has not found a GI or PCP in [place] yet.    Routing to Dr. Caesar Chestnut to auth/refuse.

## 2019-01-08 ENCOUNTER — Encounter: Admit: 2019-01-08 | Discharge: 2019-01-08 | Payer: BC Managed Care – PPO

## 2019-01-08 NOTE — Telephone Encounter
Pt LVM requesting all records to be sent to new GI provider in [place]. Attempted to call pt. LVM stating I cannot send records. Must make a request to [place] records. Provided ph # and fax #. Also sent Mychart message w/ same info. Left office number to call back if any questions or concerns.

## 2019-04-08 ENCOUNTER — Encounter: Admit: 2019-04-08 | Discharge: 2019-04-08 | Payer: BC Managed Care – PPO

## 2019-04-08 NOTE — Telephone Encounter
on Stelara 90 mg SQ q 8 weeks. Dispense 1 pen w/ 1 refill. Pt does not have labs on file since June. In the previous refill encounter, pt stated she moved to [facility] and was hoping to keep Dr. Caesar Chestnut until she found a new GI provider. As of December 2020 pt has not found a GI or PCP in [place] yet.    Routing to Dr. Caesar Chestnut to auth/refuse.

## 2019-04-09 MED ORDER — USTEKINUMAB 90 MG/ML SC SYRG
90 mg | SUBCUTANEOUS | 1 refills
Start: 2019-04-09 — End: ?

## 2019-09-30 ENCOUNTER — Encounter: Admit: 2019-09-30 | Discharge: 2019-09-30 | Payer: BC Managed Care – PPO

## 2020-10-28 IMAGING — CT CT SINUS WITHOUT CONTRAST
3 series · 18 of 47 positions shown, 21 images · non-contrast
Comparison: There are no previous exams available for comparison.

________________________________________________________________________________________________ 
CT SINUS WITHOUT CONTRAST, 10/28/2020 [DATE]: 
CLINICAL INDICATION: Headaches and sinus pressure. 
A search for DICOM formatted images was conducted for prior CT imaging studies 
completed at a non-affiliated media free facility.
TECHNIQUE: The paranasal sinuses were scanned without contrast on a high 
resolution CT scanner using dose reduction techniques.  Routine MPR 
reconstructions were performed.

[Series 202: cor, idose (1) · coronal · 0.34mm/px · 3 of 172 slices shown]
[im 58/172  bone]
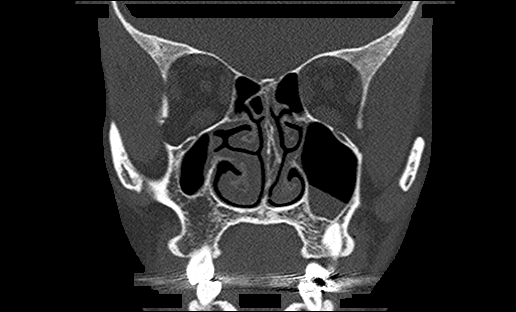
[im 77/172  bone]
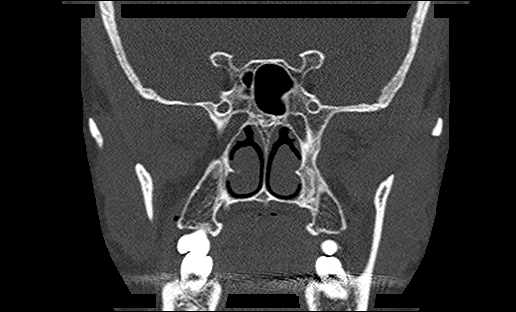
[im 96/172  bone]
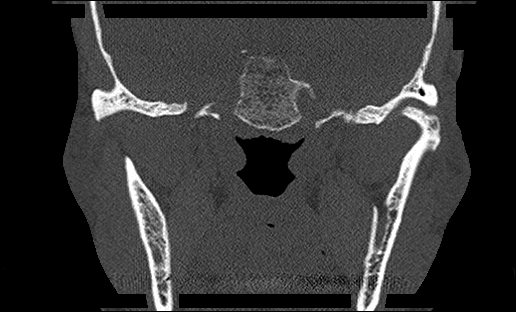

[Series 203: sag, idose (1) · sagittal · 0.34mm/px · 3 of 173 slices shown]
[im 58/173  bone]
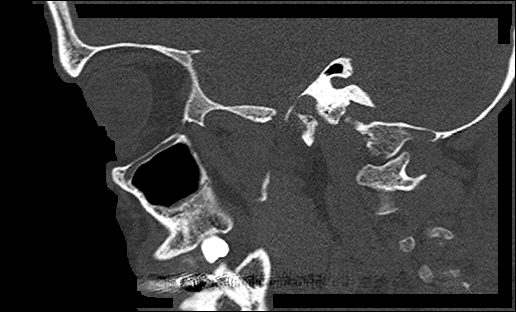
[im 87/173  bone]
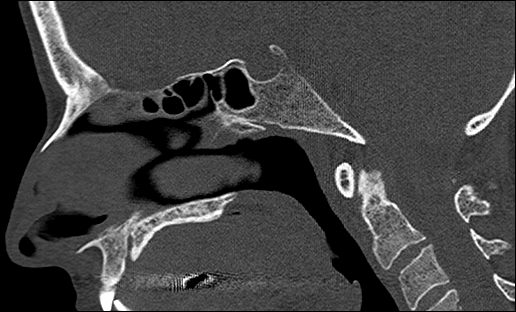
[im 115/173  bone]
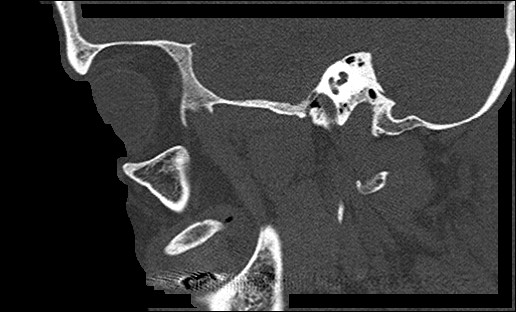

[Series 204: sinus st, idose (3) · axial · 0.34mm/px · z∈[+41,+131]mm · 12 of 131 slices shown, 15 images]
[im 9/131  brain]
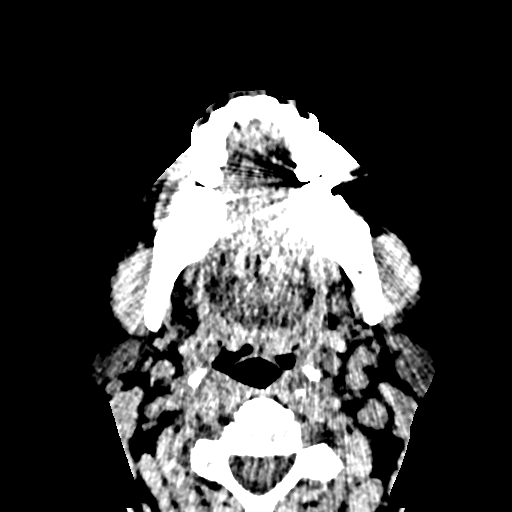
[im 9/131  bone]
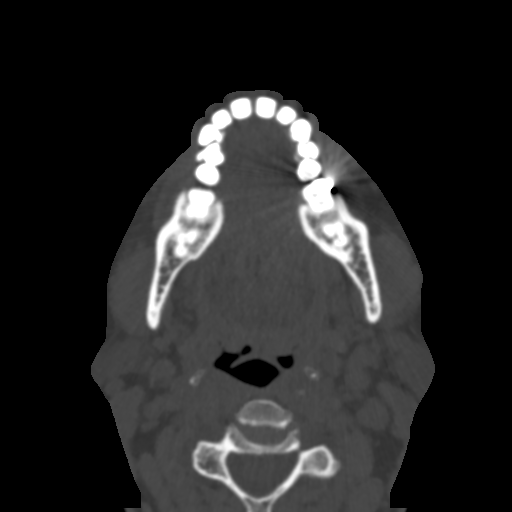
[im 18/131  bone]
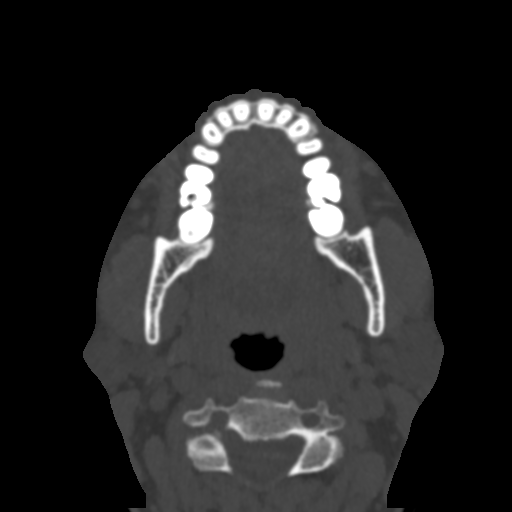
[im 27/131  bone]
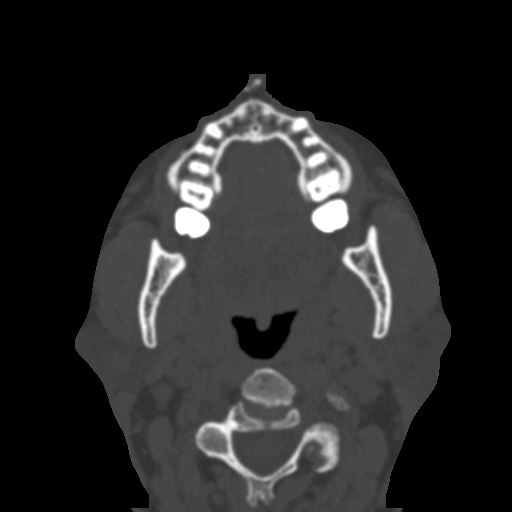
[im 41/131  bone]
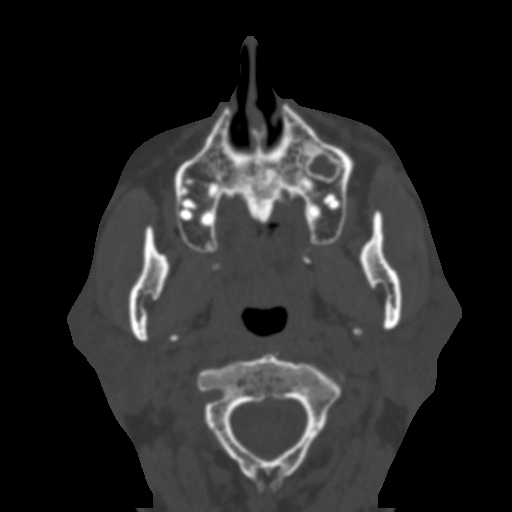
[im 50/131  brain]
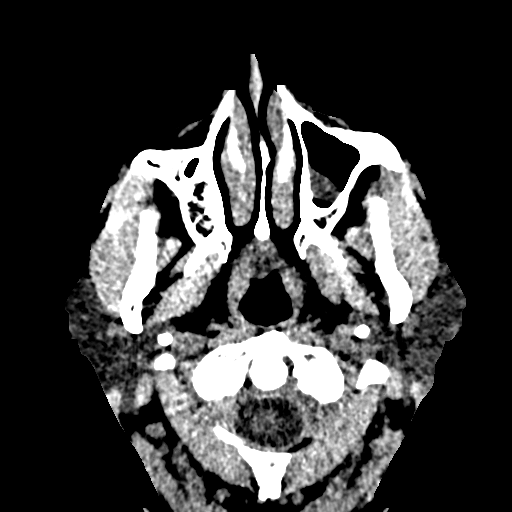
[im 50/131  bone]
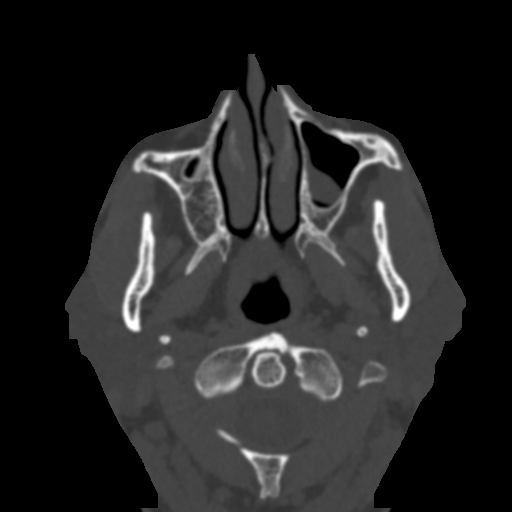
[im 59/131  bone]
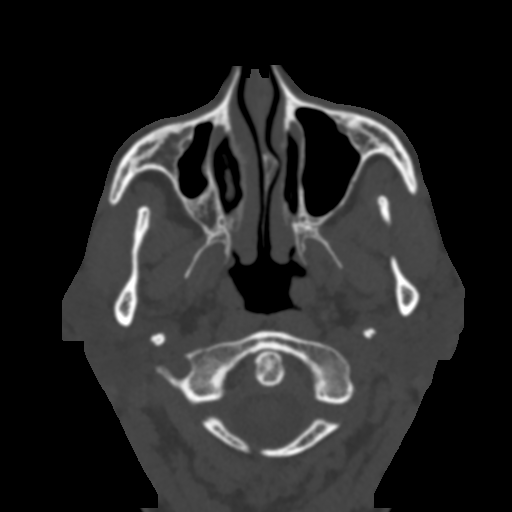
[im 72/131  bone]
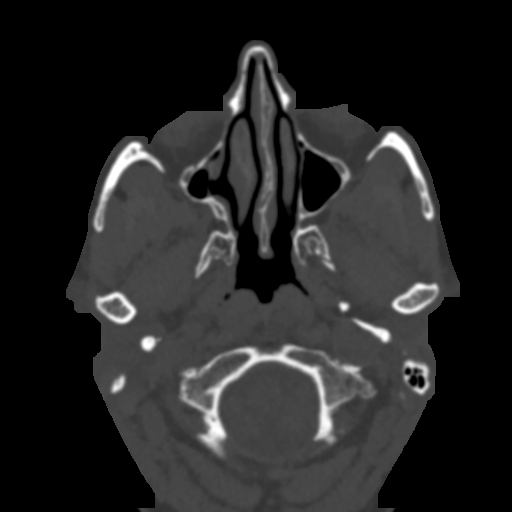
[im 81/131  bone]
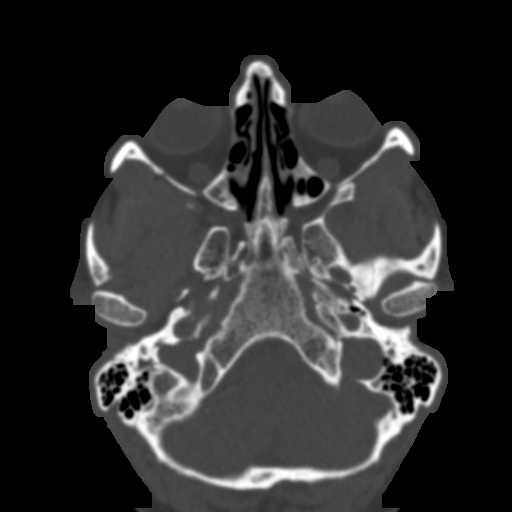
[im 90/131  brain]
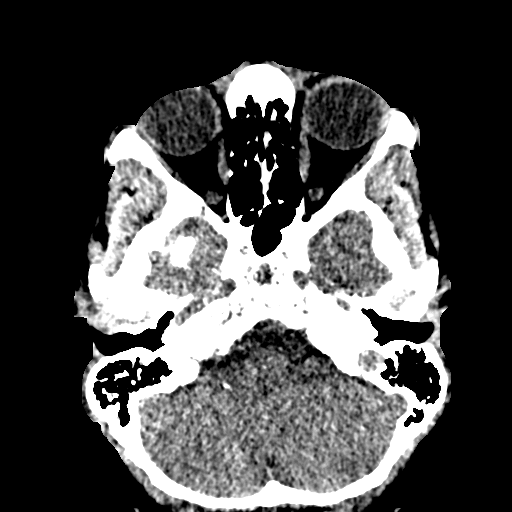
[im 90/131  bone]
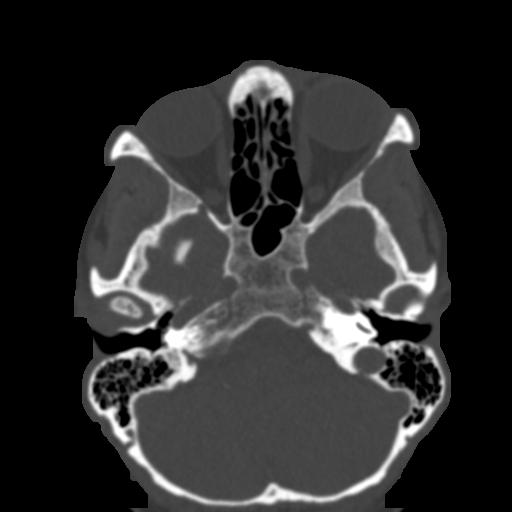
[im 104/131  bone]
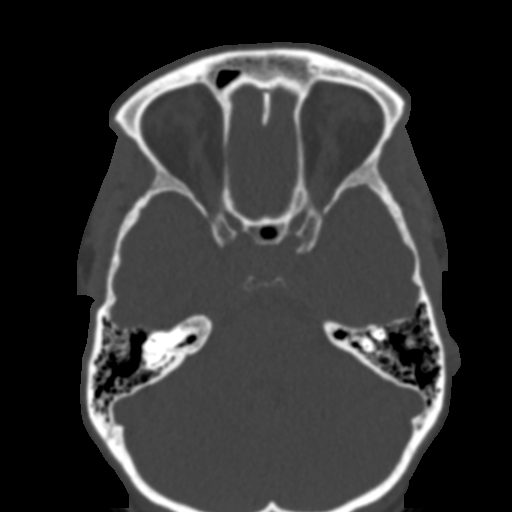
[im 113/131  bone]
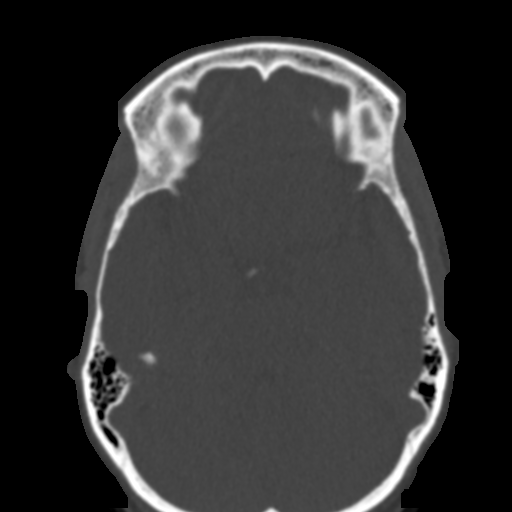
[im 122/131  bone]
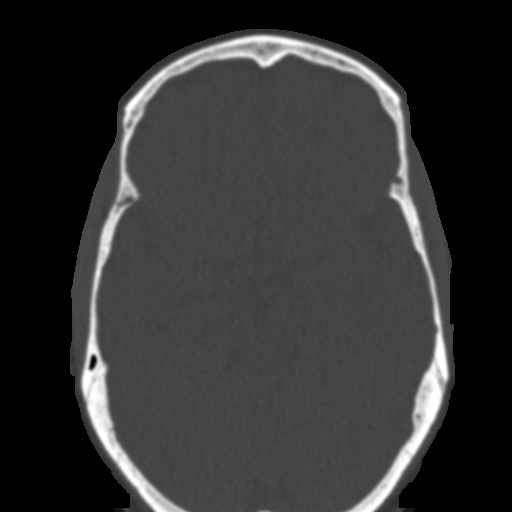

[18 of 47 positions shown; findings below may reference images not displayed]

FINDINGS: FRONTAL SINUSES: Clear. 

ETHMOID AIR CELLS: Mild mucosal thickening. 

SPHENOID SINUS: Clear. 

MAXILLARY SINUSES: Mucosal thickening with mucous retention cysts the largest in 
the inferior aspect of the left maxillary sinus measures approximately 2.1 x
cm.. 

NASAL CAVITY: Slight deviation of the septum left but no significant spur. No 
polyps. Mild hypertrophy of the inferior turbinates. 

NASOPHARYNX: Adenoids within normal limits for age. No mass in the distribution 
of the visualized eustachian tube present. Parapharyngeal space clear.   

OSTIOMEATAL UNITS: Bilaterally patent. 

REMAINDER OF SKULL INCLUDING ORBITS AND SURROUNDING SOFT TISSUES: No mass or 
inflammation.
IMPRESSION: Chronic maxillary and ethmoid sinusitis. 
Slight deviation of the septum to the left. Mild hypertrophy of the inferior 
turbinates..  
RADIATION DOSE REDUCTION: All CT scans are performed using radiation dose 
reduction techniques, when applicable.  Technical factors are evaluated and 
adjusted to ensure appropriate moderation of exposure.  Automated dose 
management technology is applied to adjust the radiation doses to minimize 
exposure while achieving diagnostic quality images.

## 2021-03-02 ENCOUNTER — Encounter: Admit: 2021-03-02 | Discharge: 2021-03-02 | Payer: BC Managed Care – PPO

## 2022-10-20 IMAGING — DX KNEE 2 VIEWS LEFT
2 series · 2 of 2 positions shown · non-contrast
Comparison: None.

________________________________________________________________________________________________ 
KNEE 2 VIEWS LEFT, 10/20/2022 [DATE]: 
CLINICAL INDICATION: Pain In Left Knee

[AP]
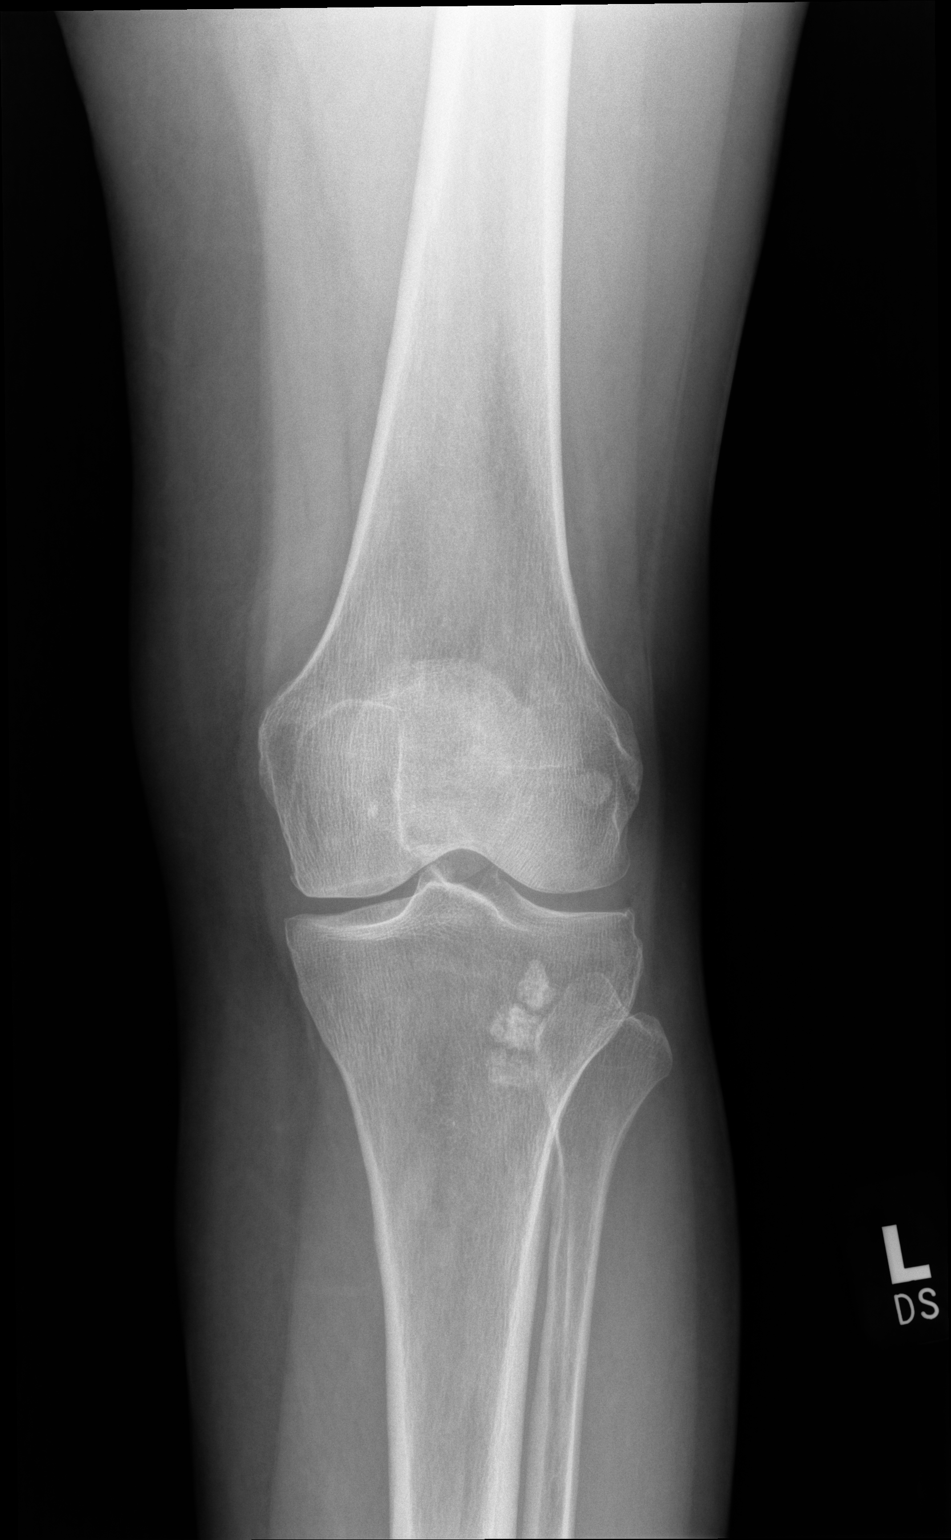

[lateral]
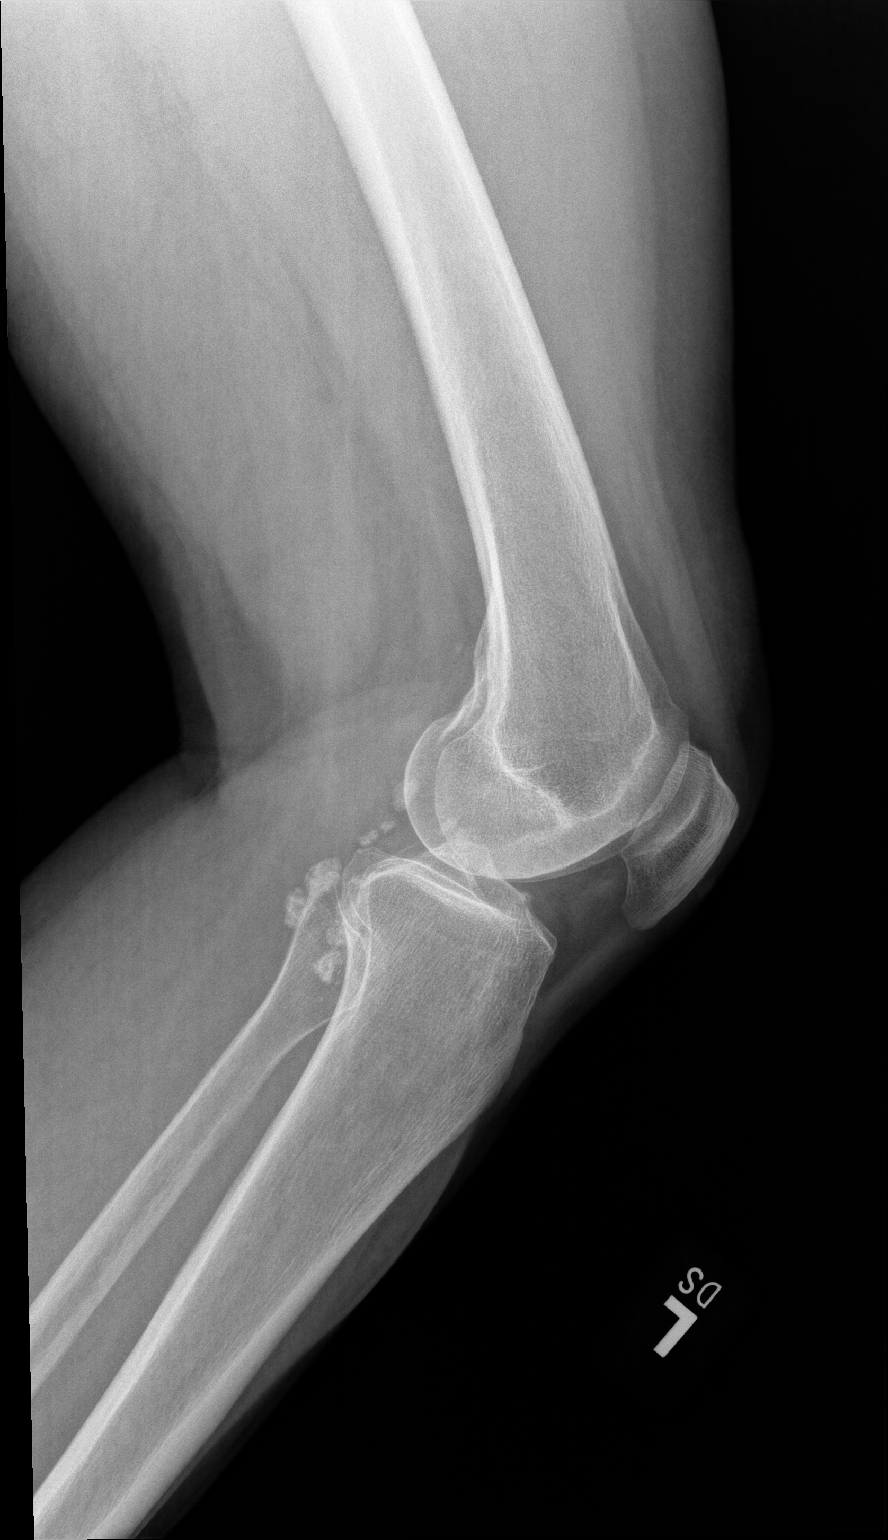

[2 of 2 positions shown; findings below may reference images not displayed]

FINDINGS: No fracture. Normal alignment. Joint space heights are preserved. 
Tiny osteophytes. Multiple intra-articular bodies posterior to the lateral 
proximal tibia, likely the popliteus tendon sheath. No effusion. Normal bone 
mineralization.
IMPRESSION: Degenerative change.

## 2022-12-11 IMAGING — CT CT BRAIN WITHOUT CONTRAST
3 series · 15 of 47 positions shown, 18 images · non-contrast
Comparison: CT sinus October 28, 2020.

________________________________________________________________________________________________ 
CT BRAIN WITHOUT CONTRAST, 12/11/2022 [DATE]: 
CLINICAL INDICATION: Migraine Naenae/Tora Matthew, not intractable, w/o status migrainosus 
, progressive over months.  
A search for DICOM formatted images was conducted for prior CT imaging studies 
completed at a non-affiliated media free facility.
TECHNIQUE: The head was scanned from vertex through skull base without contrast 
on a high resolution CT scanner using dose reduction techniques. Routine MPR 
reconstructions were performed.

[Series 2: head w/o 3.0 j30s 1 · axial · non-contrast · 0.41mm/px · z∈[+1155,+1290]mm · 9 of 53 slices shown, 12 images]
[im 4/53  brain]
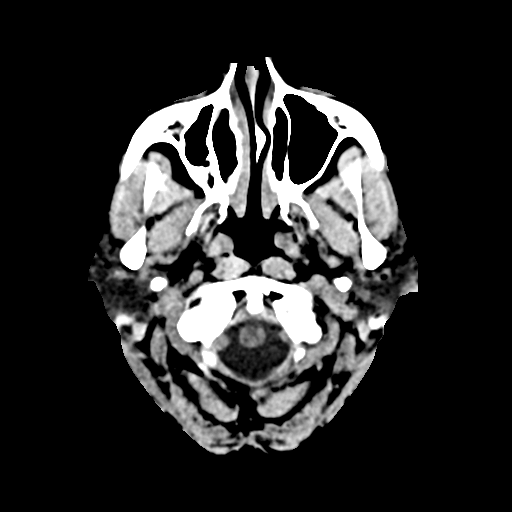
[im 4/53  bone]
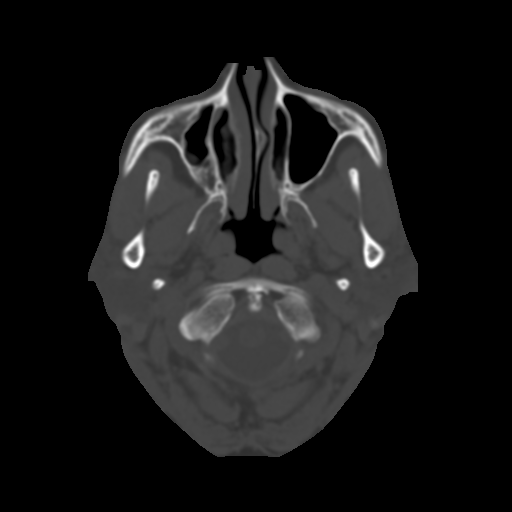
[im 9/53  brain]
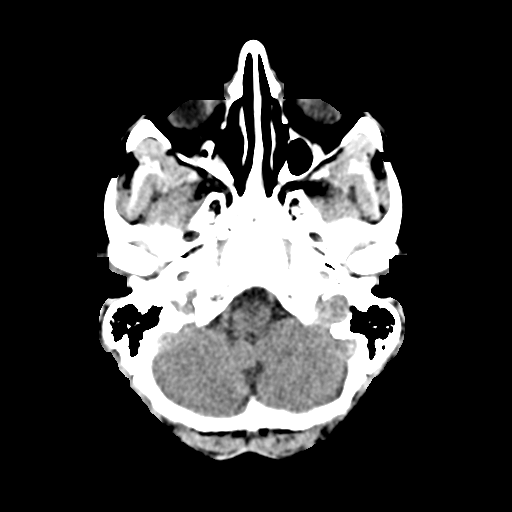
[im 15/53  brain]
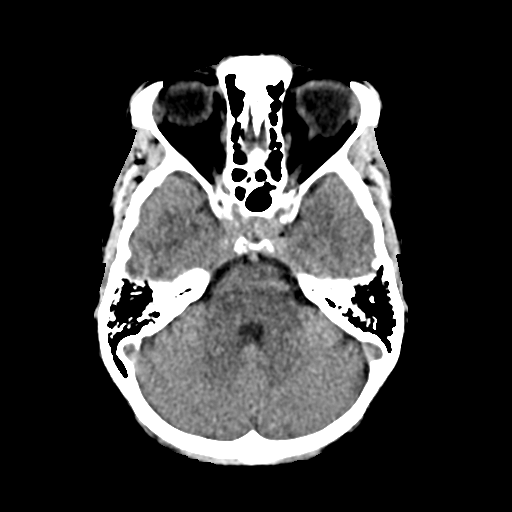
[im 20/53  brain]
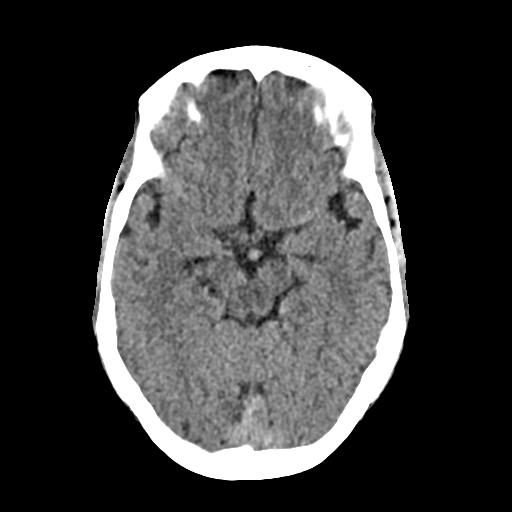
[im 27/53  brain]
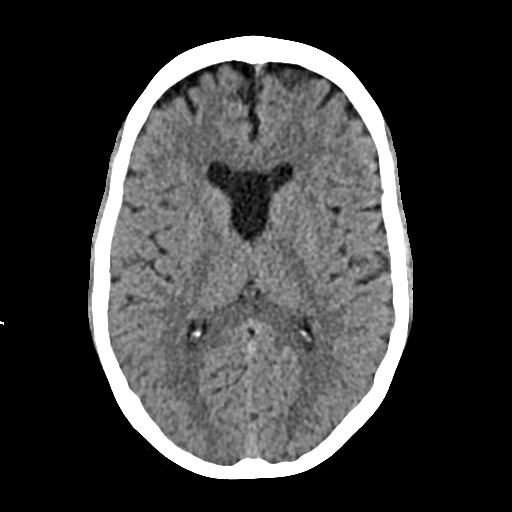
[im 27/53  bone]
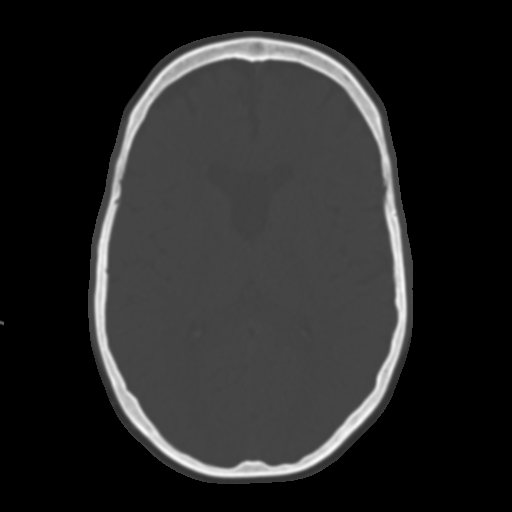
[im 33/53  brain]
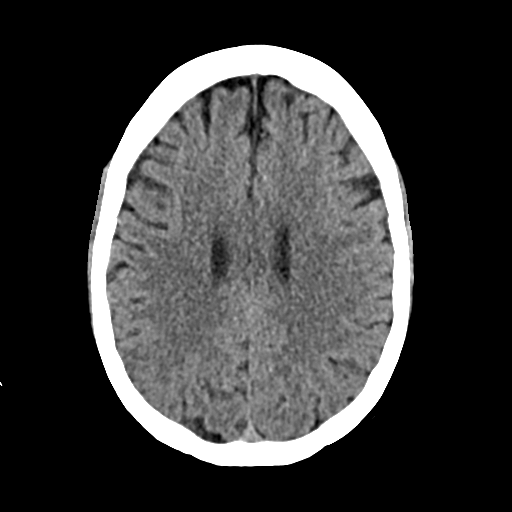
[im 38/53  brain]
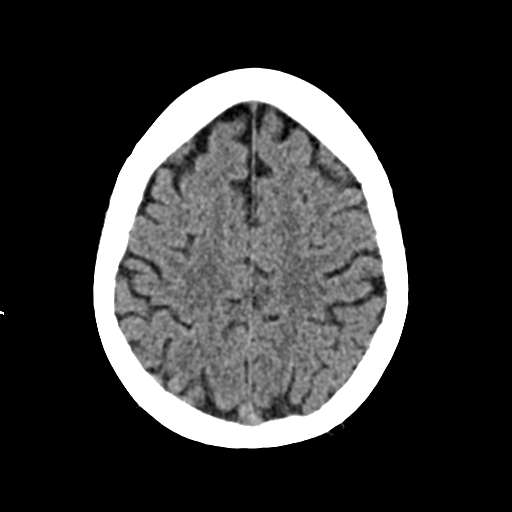
[im 44/53  brain]
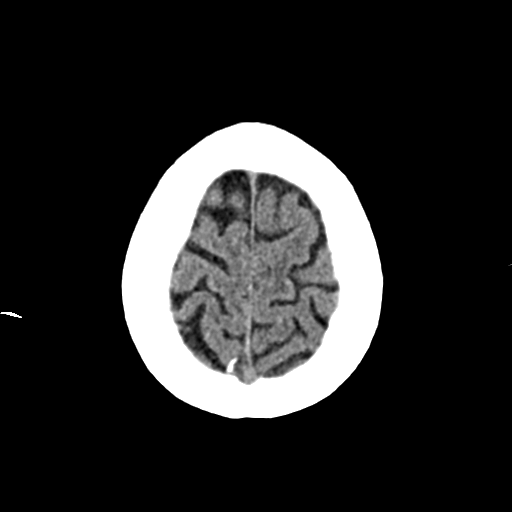
[im 49/53  brain]
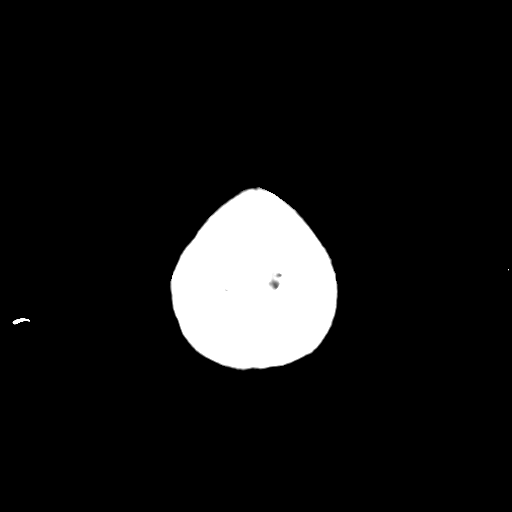
[im 49/53  bone]
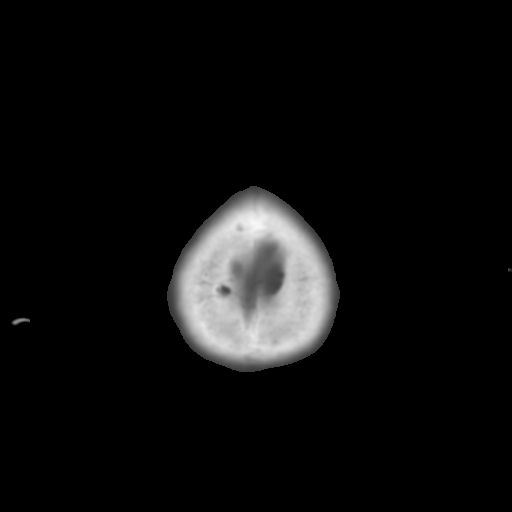

[Series 4: coronal · coronal · 0.33mm/px · 3 of 100 slices shown]
[im 34/100  brain]
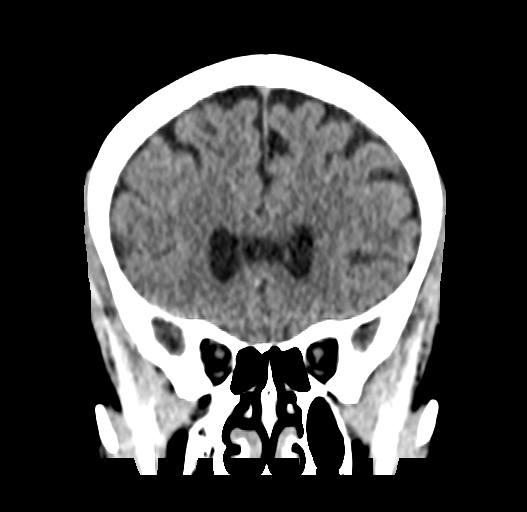
[im 45/100  brain]
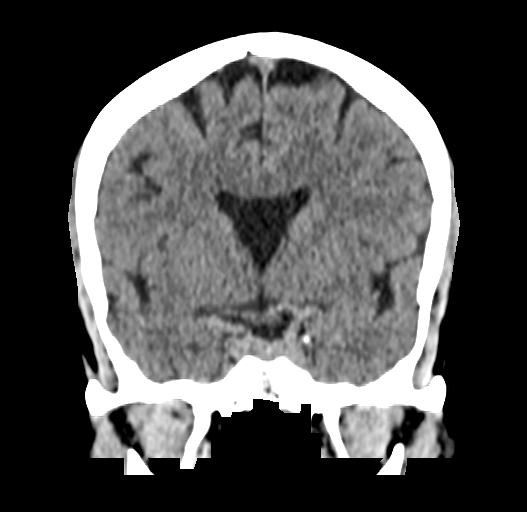
[im 56/100  brain]
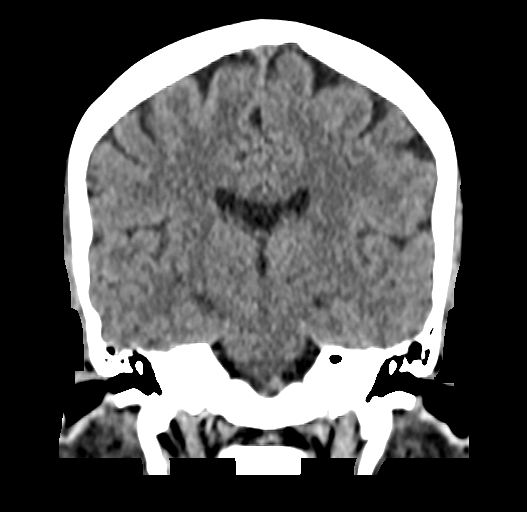

[Series 5: sag · sagittal · 0.34mm/px · 3 of 58 slices shown]
[im 20/58  brain]
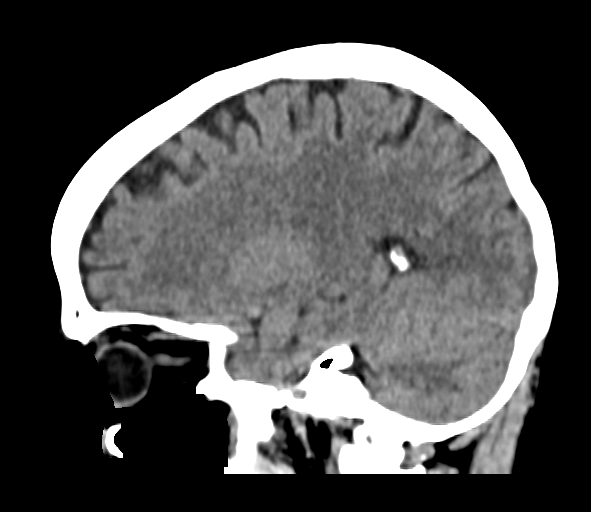
[im 29/58  brain]
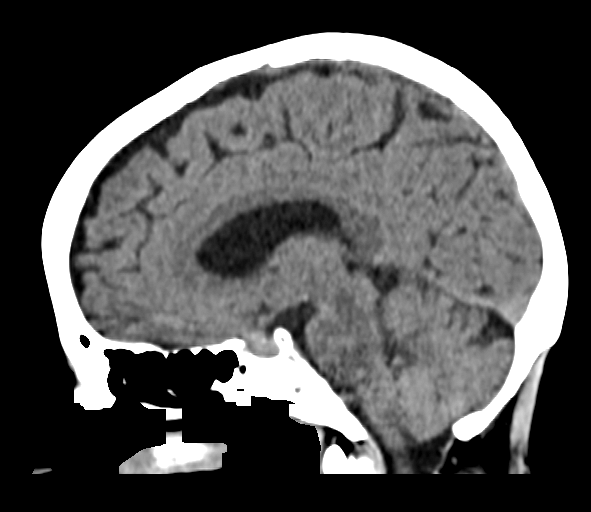
[im 39/58  brain]
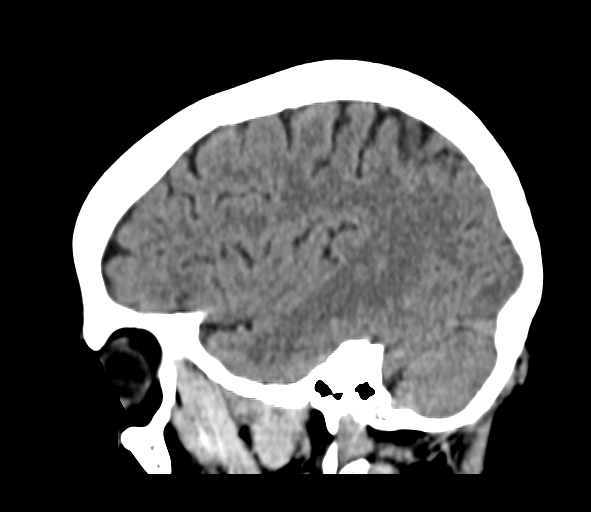

[15 of 47 positions shown; findings below may reference images not displayed]

Count of known CT and Cardiac Nuclear Medicine studies performed in the previous 
12 months = 0.
FINDINGS: --------------------------------------------------------------------      
INTRACRANIAL: 
Cavum septum pellucidum and cavum vergae. There is a subcentimeter, 4 mm right 
caudate head lacunar type infarct. Otherwise there is minimal nonspecific 
periventricular low-attenuation, likely reflective of chronic microangiopathy. 
No mass or abnormal extra-axial fluid collection. 
No acute intracranial hemorrhage, mass effect, or midline shift. No CT evidence 
of large acute territorial ischemia.  Please note MRI is more sensitive for 
acute ischemia. 
-------------------------------------------------------------------- 
OTHER:  
ORBITS/SINUSES/T-BONES:  Visualized orbits show no acute abnormality or mass.  
Mastoid air cells and middle ear cavities are grossly clear.  Small caliber 
right maxillary sinus. Relatively stable bilateral maxillary sinus membrane 
thickening. 
BONES/SOFT TISSUES: No acute abnormality. 
--------------------------------------------------------------------
IMPRESSION: No acute cranial process. Minimal presumed chronic microangiopathy. Tiny right 
caudate head lacunar type infarct. 
Relatively stable bilateral maxillary sinus membrane thickening. 
RADIATION DOSE REDUCTION: All CT scans are performed using radiation dose 
reduction techniques, when applicable.  Technical factors are evaluated and 
adjusted to ensure appropriate moderation of exposure.  Automated dose 
management technology is applied to adjust the radiation doses to minimize 
exposure while achieving diagnostic quality images.

## 2023-01-20 IMAGING — MR MRI BRAIN WITHOUT CONTRAST
8 of 11 series · 25 of 48 positions shown · IV contrast (gadolinium)
Comparison: CT brain 12/11/2022.

________________________________________________________________________________________________ 
MRI BRAIN WITHOUT CONTRAST, 01/20/2023 [DATE]: 
CLINICAL INDICATION: Other Lacunar Syndromes
TECHNIQUE: Multiplanar, multiecho position MR images of the brain were performed 
without intravenous gadolinium enhancement. Patient was scanned on a
magnet.

[Series 102: mpr - smartbrain · axial · 1.1mm · 1.09mm/px · 1 of 2 slices shown]
[im 1/2]
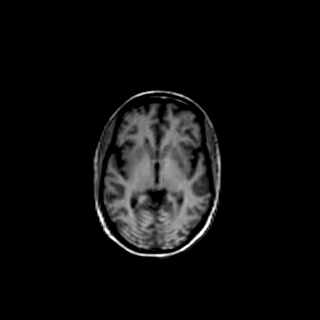

[Series 203: dadc map · axial · 5.0mm · 1.00mm/px · z∈[-97,+58]mm · 2 of 25 slices shown (1 of 2)]
[im 1/25]
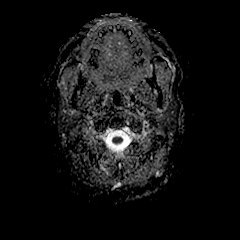
[im 25/25]
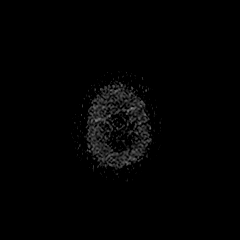

[Series 204: (id) · axial · 5.0mm · 1.00mm/px · z∈[-97,+58]mm · 2 of 27 slices shown (1 of 2)]
[im 1/27]
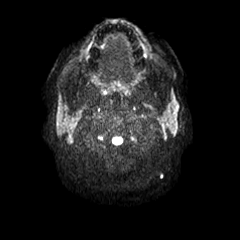
[im 27/27]
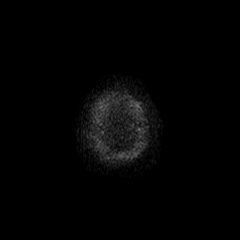

[Series 303: dadc map · coronal · 5.0mm · 0.81mm/px · 3 of 29 slices shown (2 of 2)]
[im 1/29]
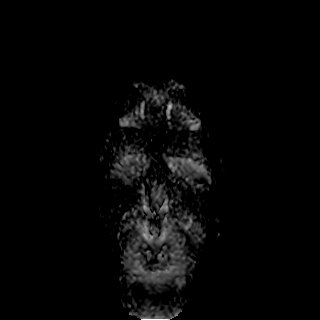
[im 15/29]
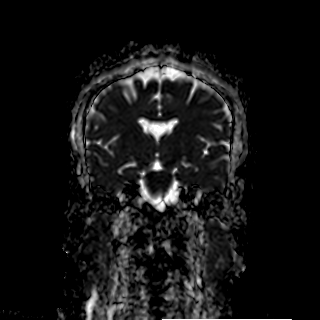
[im 29/29]
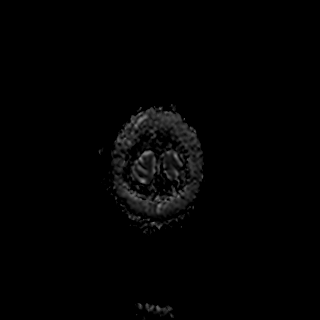

[Series 304: (id) · coronal · 5.0mm · 0.81mm/px · 3 of 29 slices shown (2 of 2)]
[im 1/29]
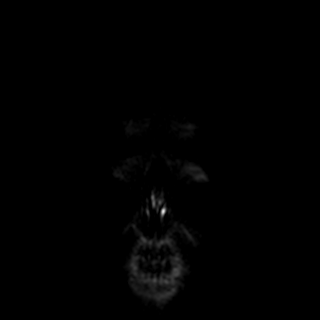
[im 15/29]
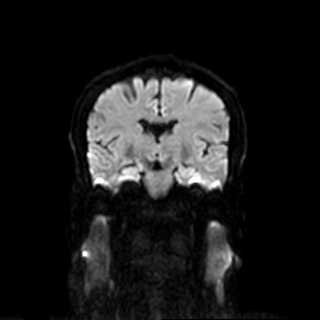
[im 29/29]
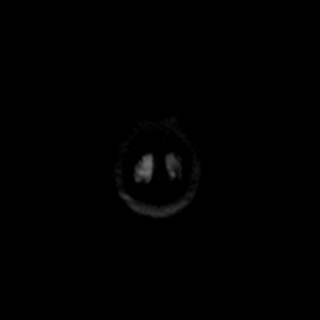

[Series 401: t1_se_sag · sagittal · 4.0mm · 0.43mm/px · 3 of 28 slices shown]
[im 1/28]
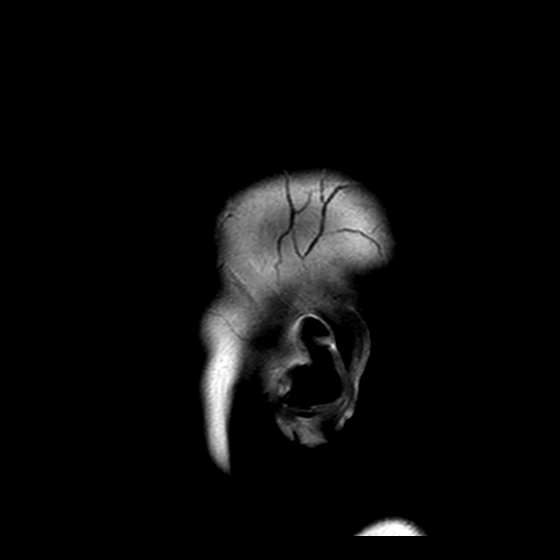
[im 14/28]
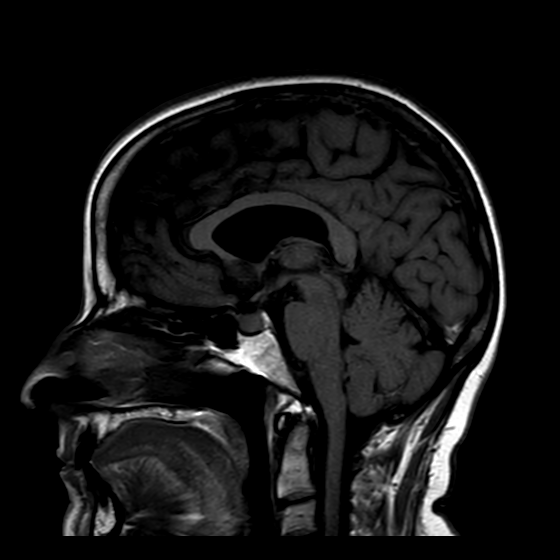
[im 28/28]
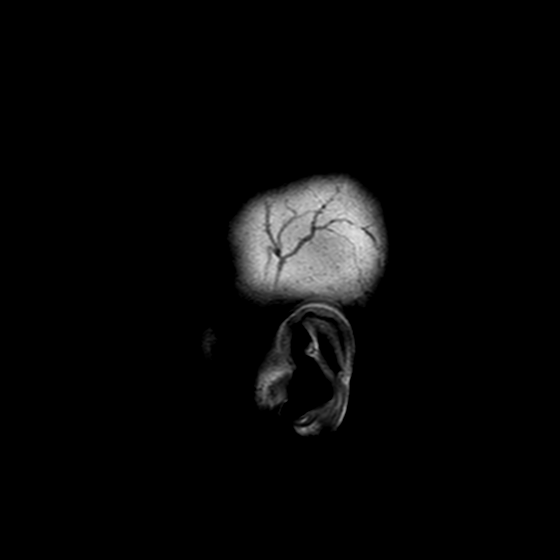

[Series 501: flair_ax+fs · axial · 5.0mm · 0.49mm/px · z∈[-96,+59]mm · 3 of 27 slices shown]
[im 1/27]
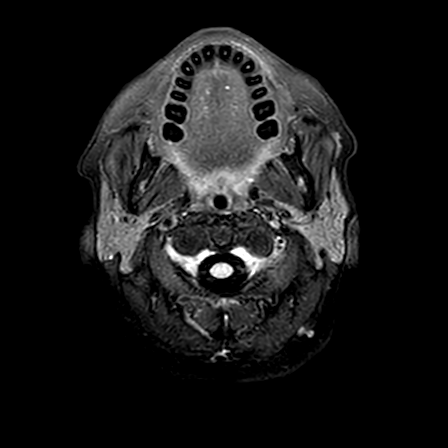
[im 14/27]
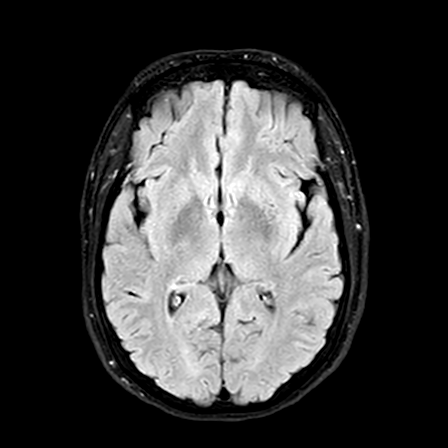
[im 27/27]
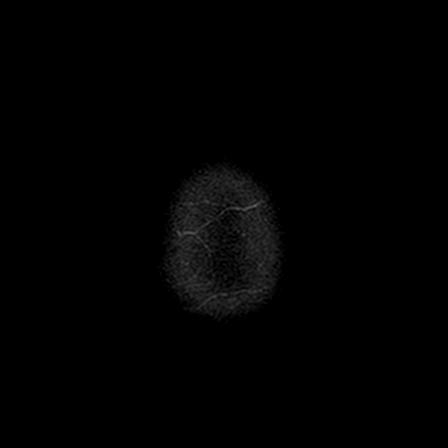

[Series 602: swip · axial · 10.0mm · 0.36mm/px · z∈[-80,+58]mm · 8 of 140 slices shown]
[im 1/140]
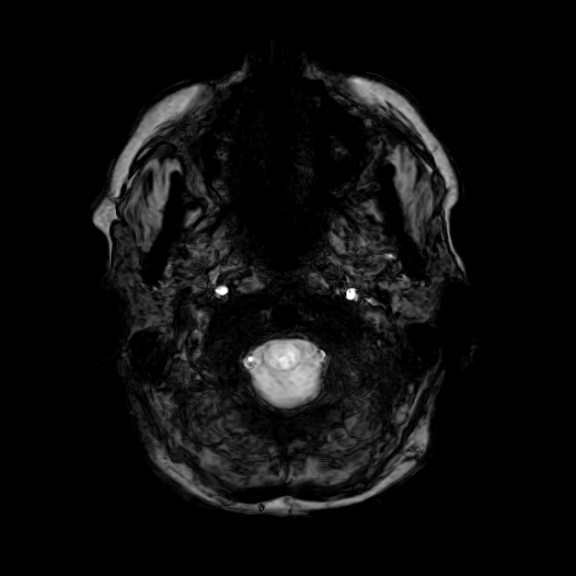
[im 22/140]
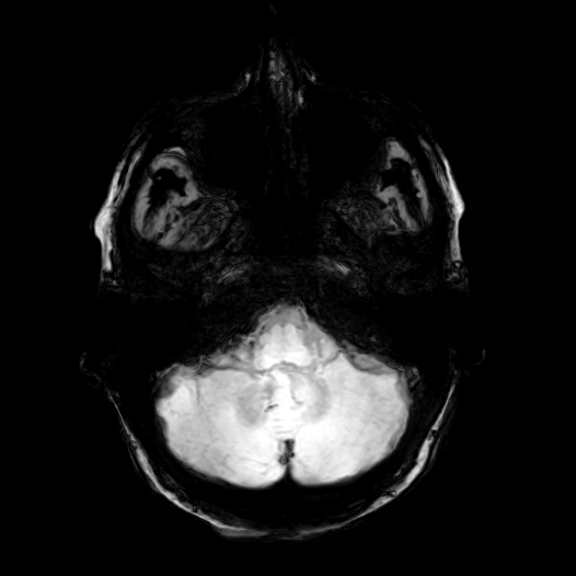
[im 43/140]
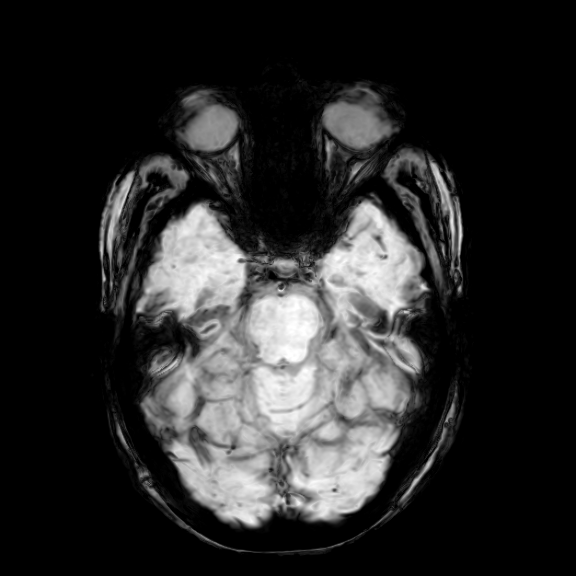
[im 65/140]
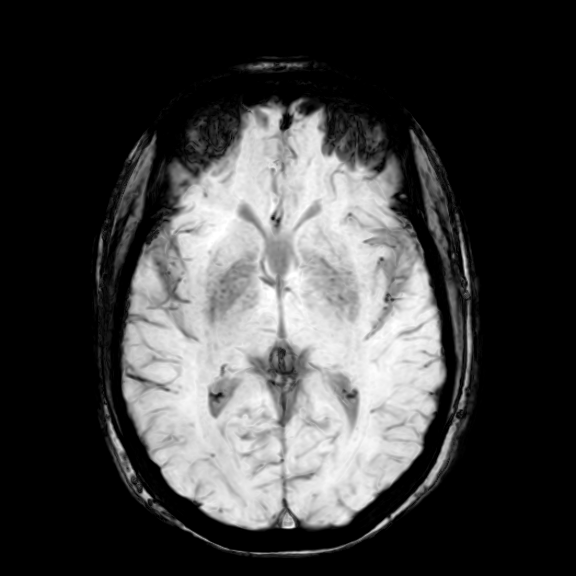
[im 75/140]
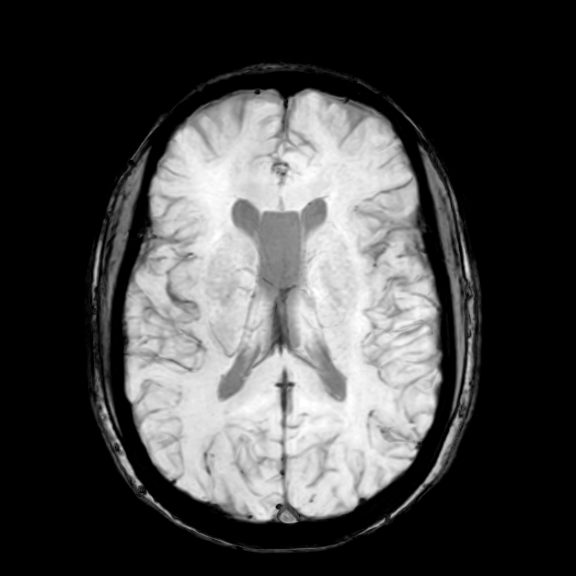
[im 97/140]
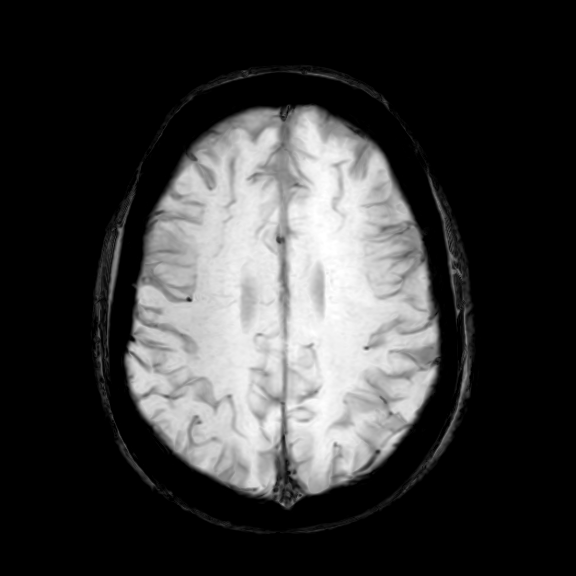
[im 118/140]
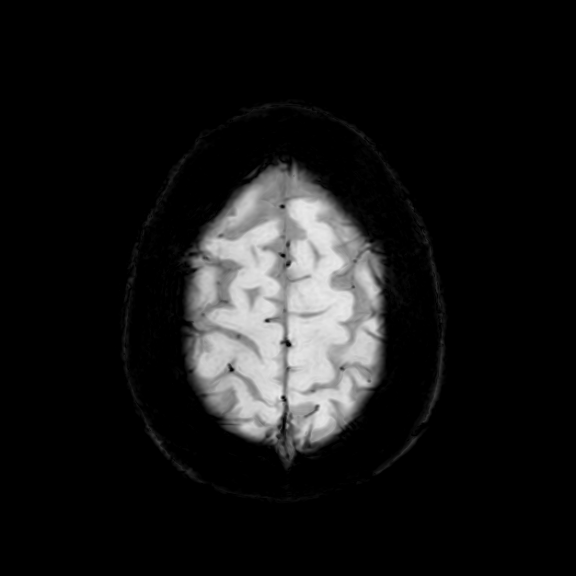
[im 140/140]
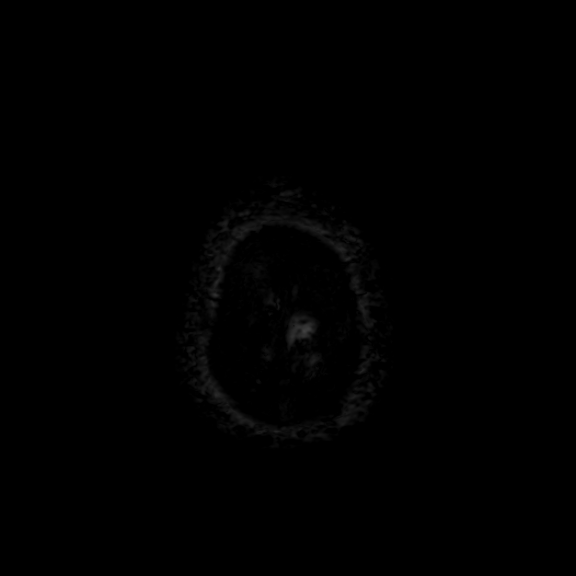

[25 of 48 positions shown; findings below may reference images not displayed]

FINDINGS: -------------------------------------------------------------------------------- 
------------------------- 
INTRACRANIAL: 
Mild small vessel disease. No acute ischemia. No abnormal foci of susceptibility 
artifact in the brain. Patency of the intracranial vascular flow voids.  No 
acute intracranial hemorrhage, mass effect, midline shift. No large sellar mass. 
Cavum septum lucidum. No hydrocephalus. Ventricles and sulci are equally 
prominent. Cerebral volume is age appropriate.  
-------------------------------------------------------------------------------- 
----------------------- 
OTHER: 
ORBITS/SINUSES/T-BONES:  Visualized orbits show no acute abnormality or mass.  
Mastoid air cells and middle ear cavities are grossly clear.  Mucosal thickening 
maxillary sinuses with mucous retention cyst left maxillary sinus 
MARROW SIGNAL/SOFT TISSUES: No focal suspect signal abnormality.  
-------------------------------------------------------------------------------- 
-------------------
IMPRESSION: Mild small vessel disease. No acute infarct, intracranial hemorrhage or focal 
mass. 
Chronic maxillary sinusitis.

## 2023-03-17 IMAGING — MR MRI RIGHT FOOT WITHOUT CONTRAST
3 of 9 series · 7 of 40 positions shown · IV contrast (gadolinium)
Comparison: None.

________________________________________________________________________________________________ 
MRI RIGHT FOOT WITHOUT CONTRAST, 03/17/2023 [DATE]: 
CLINICAL INDICATION: Hallux Rigidus, Right Foot
TECHNIQUE: Multiplanar, multiecho position MR images of the foot were performed 
without intravenous gadolinium enhancement. Patient was scanned on a 1.5T magnet

[Series 301: survey_left · axial · 10.0mm · 0.78mm/px · z∈[-117,+106]mm · 2 of 9 slices shown]
[im 1/9]
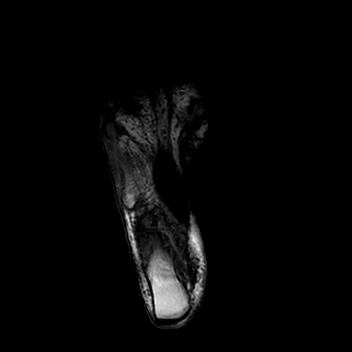
[im 9/9]
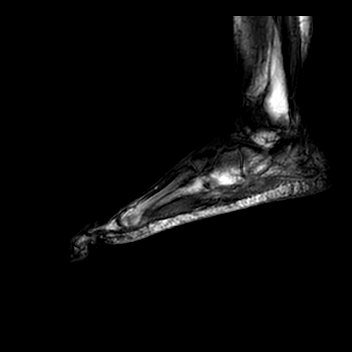

[Series 401: t1_sag_foot · sagittal · 3.0mm · 0.21mm/px · 3 of 26 slices shown]
[im 1/26]
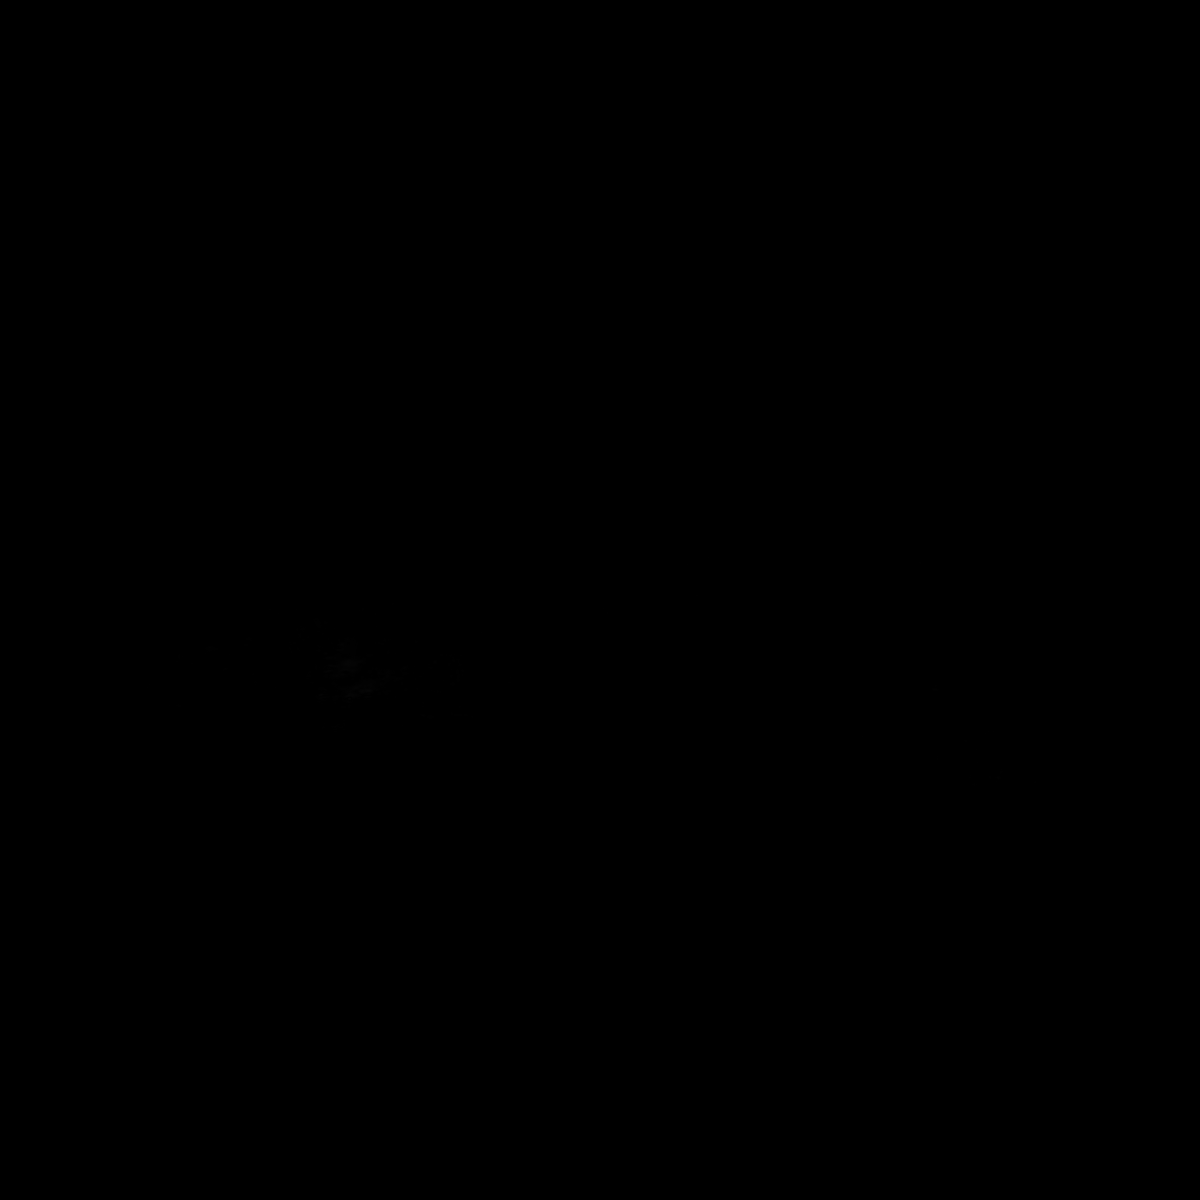
[im 13/26]
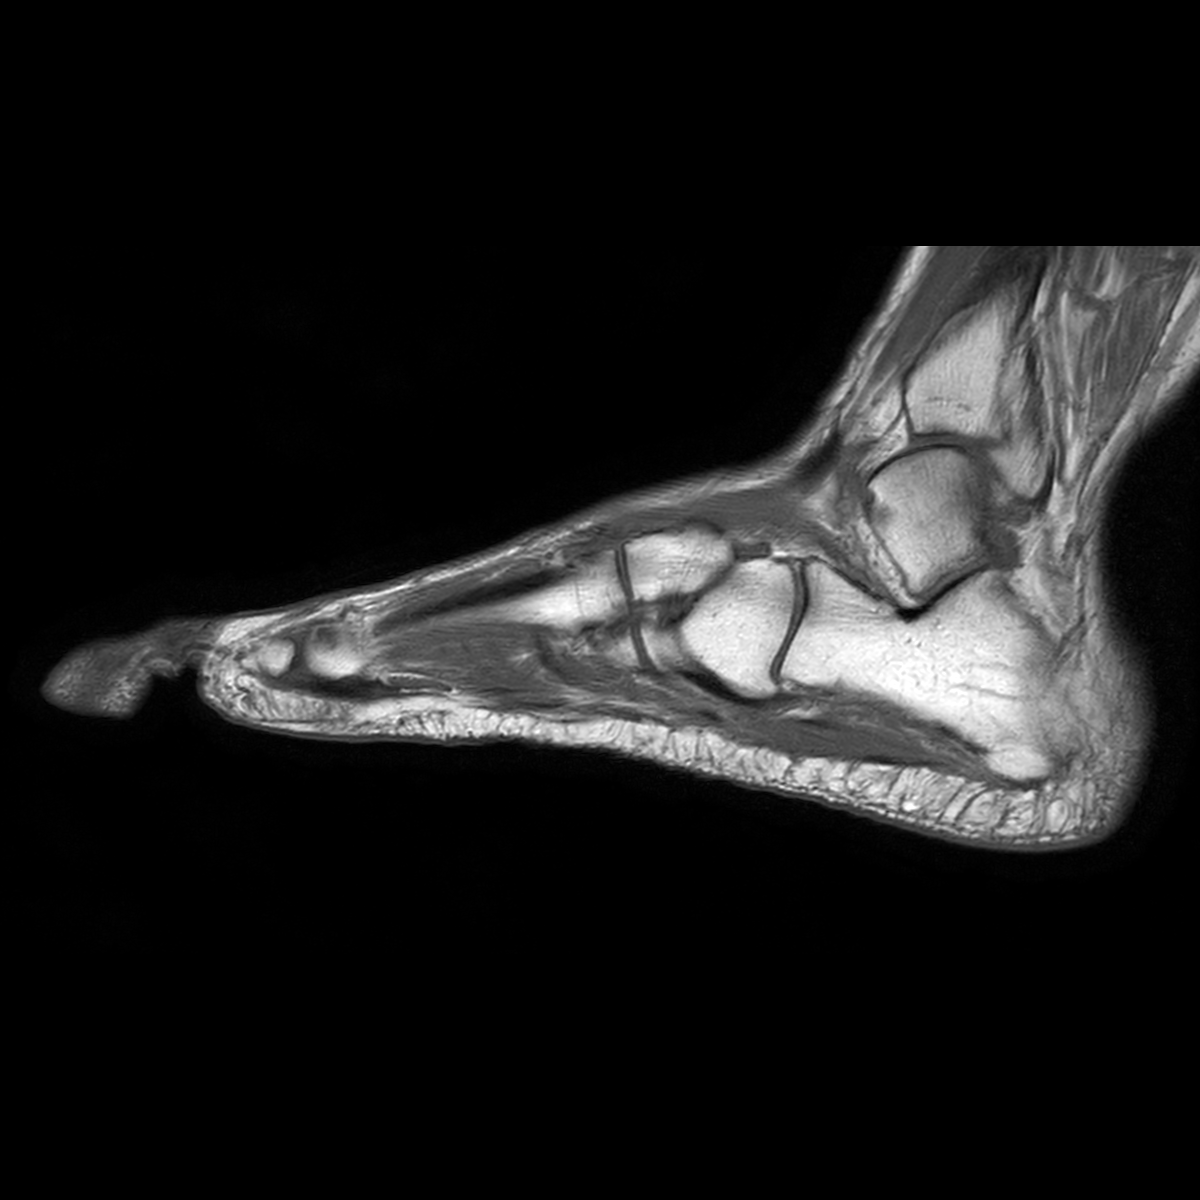
[im 26/26]
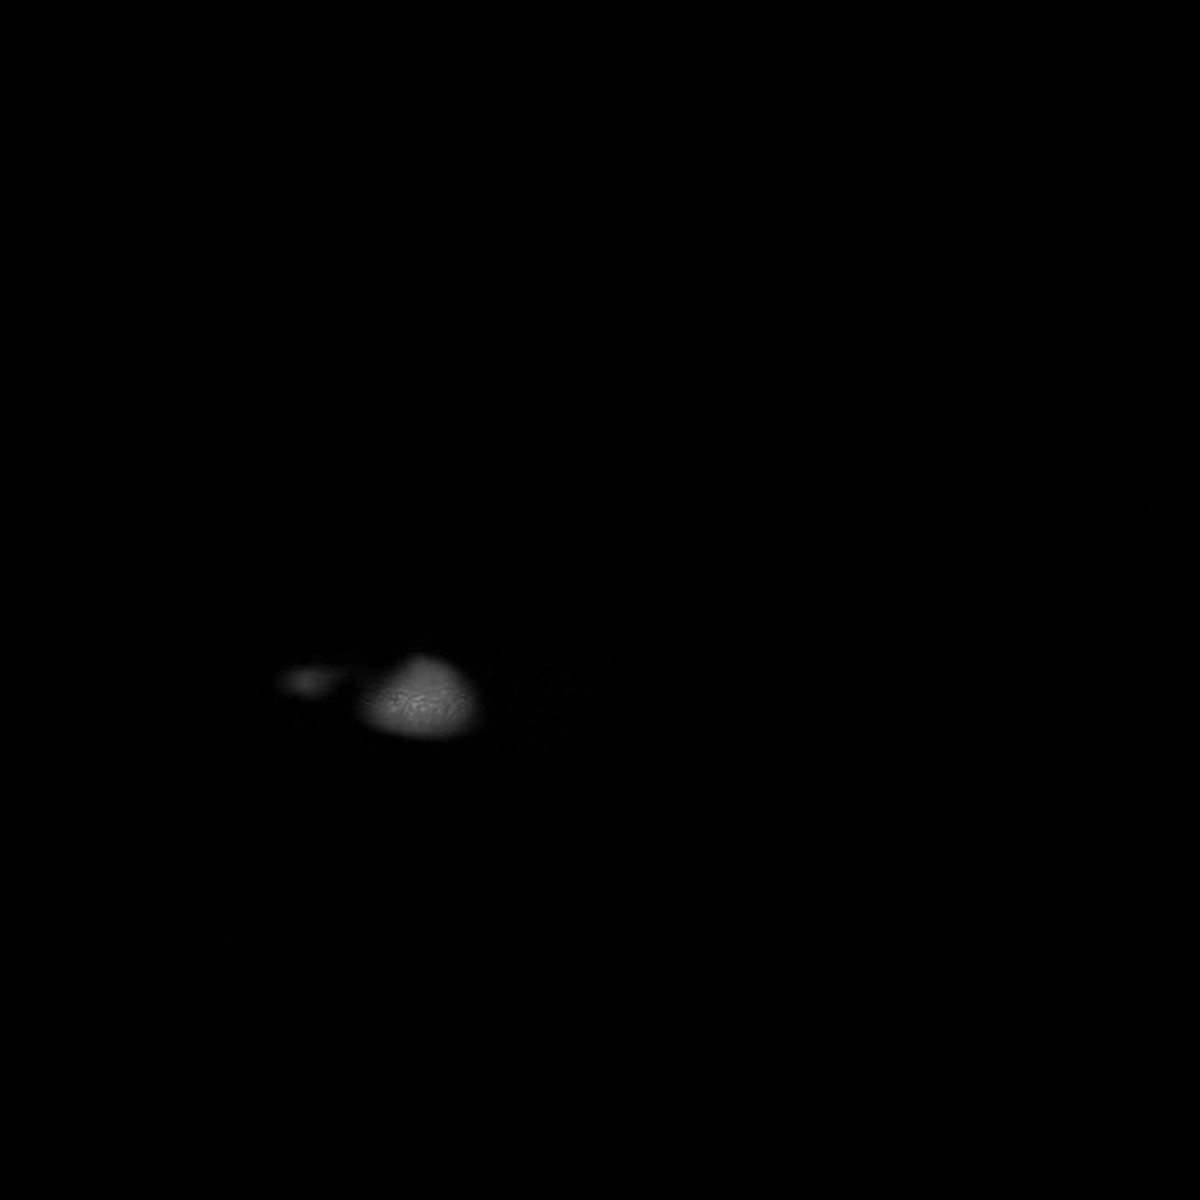

[Series 502: spd_fs_sag_ · sagittal · 3.0mm · 0.40mm/px · 2 of 25 slices shown]
[im 1/25]
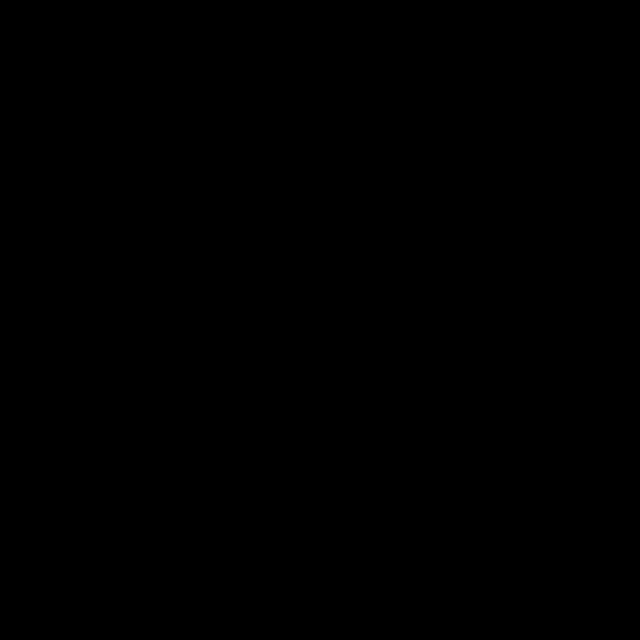
[im 17/25]
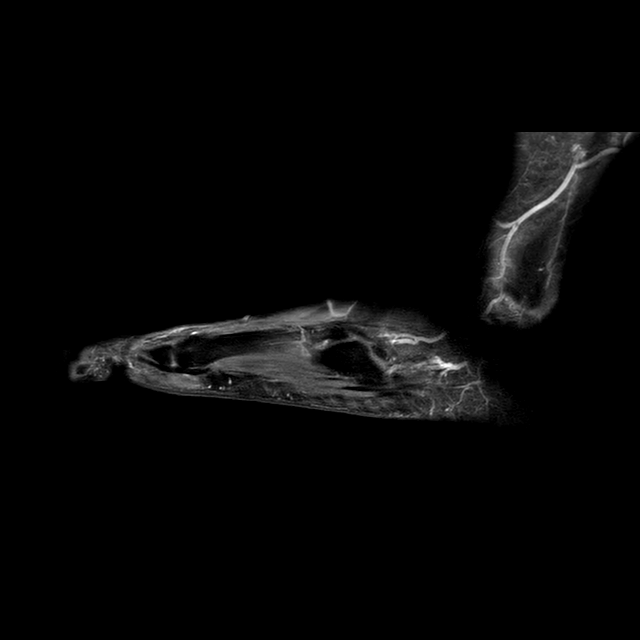

[7 of 40 positions shown; findings below may reference images not displayed]

FINDINGS: TENDONS: The flexor, extensor and peroneal tendons are intact. No tendon tear, 
tenosynovitis or tendinopathy. No tendon subluxation. The Achilles tendon is 
preserved. 
LIGAMENTS:  
LATERAL LIGAMENTS: The anterior talofibular ligament is intact. The 
calcaneofibular ligament and posterior talofibular ligament are preserved. 
SYNDESMOTIC LIGAMENTS: The anterior and posterior tibiofibular and interosseous 
ligaments are preserved. 
DELTOID LIGAMENTOUS COMPLEX: The deep and superficial components of the deltoid 
ligamentous complex are intact. 
SINUS TARSI LIGAMENTS: The cervical and interosseous ligaments are preserved. 
The inferior extensor retinaculum appears intact.  
BONES AND JOINTS: No fracture, contusion or stress response. The talar dome is 
preserved. No osteochondral lesion. Ankle and subtalar joints are preserved. 
Midfoot joints are preserved. Marked degenerative change of the hallux 
metatarsal-phalangeal (MTP) joint with full-thickness chondromalacia, metatarsal 
head osteophytes, moderate multiloculated joint effusion and pericapsular soft 
tissue swelling. Overlying dorsal skin marker indicates this is at the site of 
symptoms. Mild degenerative change of several interphalangeal (IP) joints. 
MUSCLES: Musculature is symmetric without mass, signal abnormality or atrophy.  
OTHER SOFT TISSUES: Tarsal tunnel is preserved. Sinus tarsi fat is preserved. 
Plantar fascia is intact. No ulceration. No abscess.
IMPRESSION: 1.  Marked hallux MTP joint degenerative change with metatarsal head 
osteophytes, moderate multiloculated joint effusion and pericapsular soft tissue 
swelling.  
2.  Mild degenerative change of several IP joints.

## 2023-05-01 IMAGING — DX CHEST PA AND LATERAL
2 series · 2 of 2 positions shown · non-contrast
Comparison: None.

________________________________________________________________________________________________ 
CLINICAL INDICATION: Encounter For Other Preprocedural Examination.

[PA]
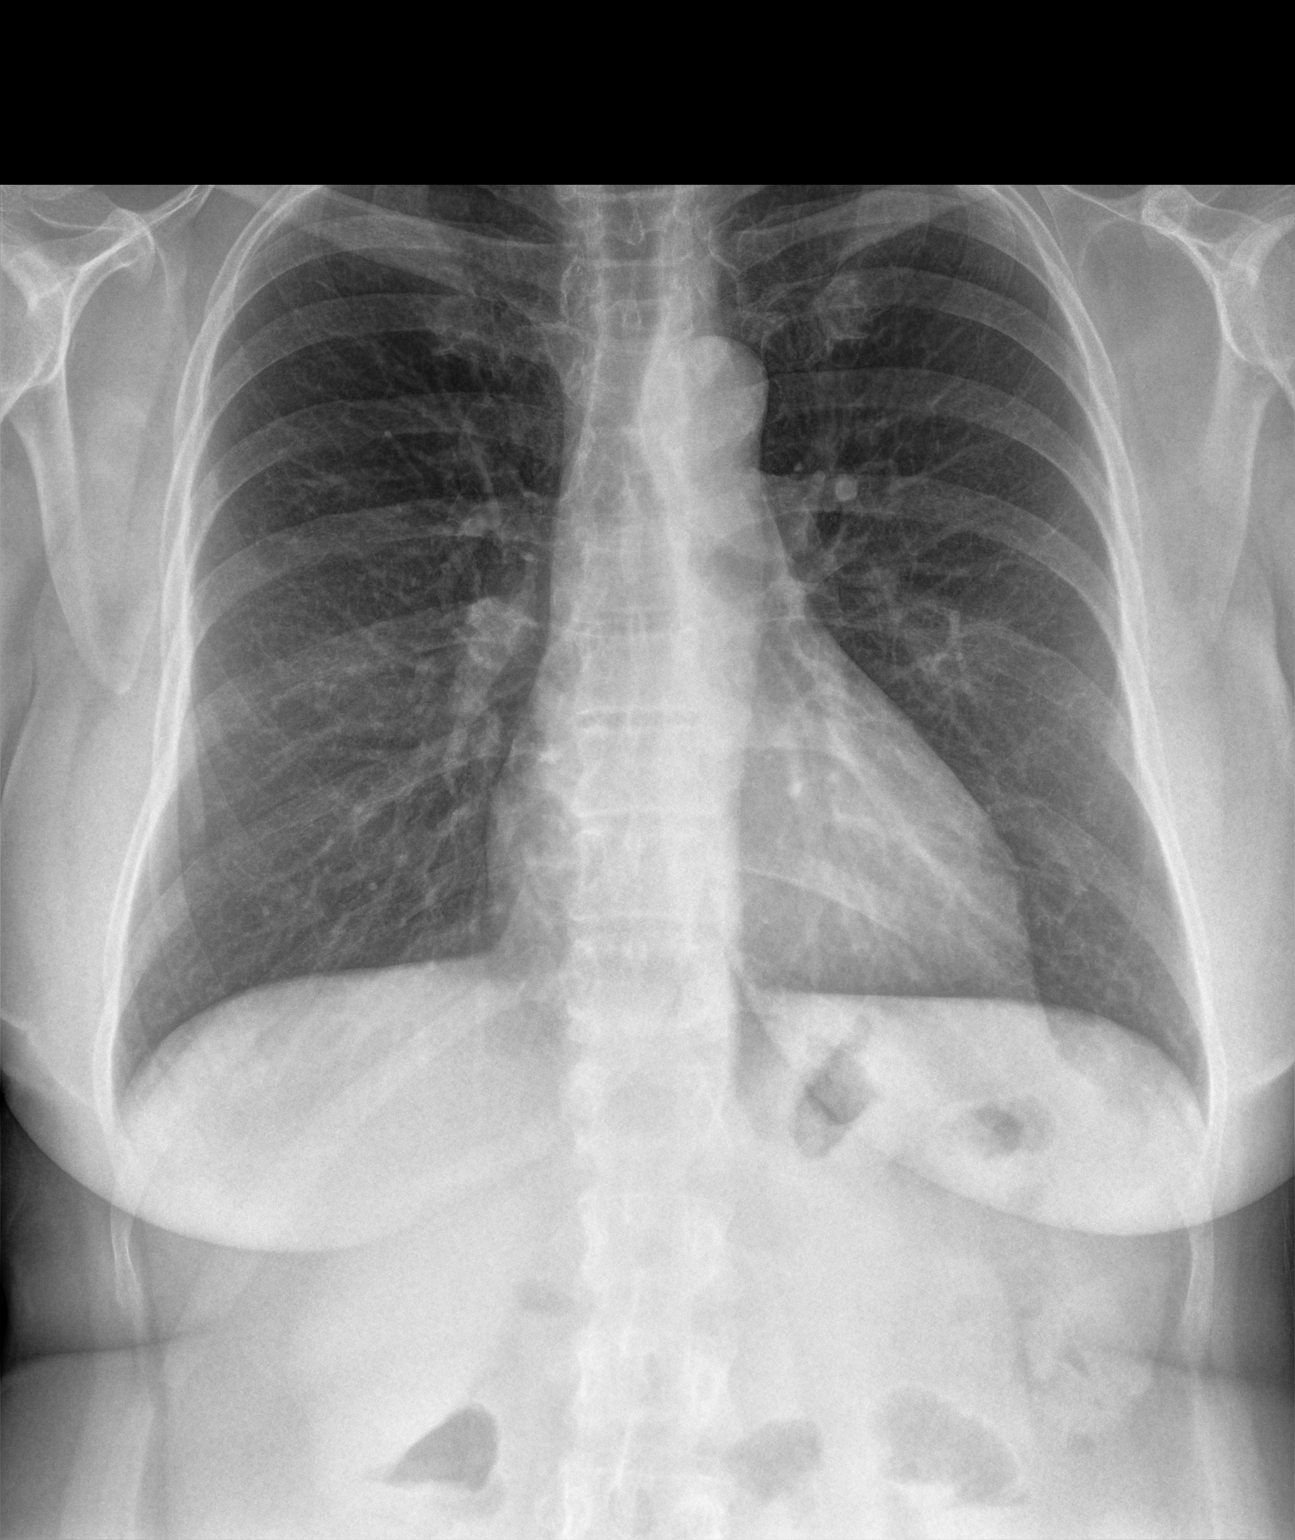

[lateral]
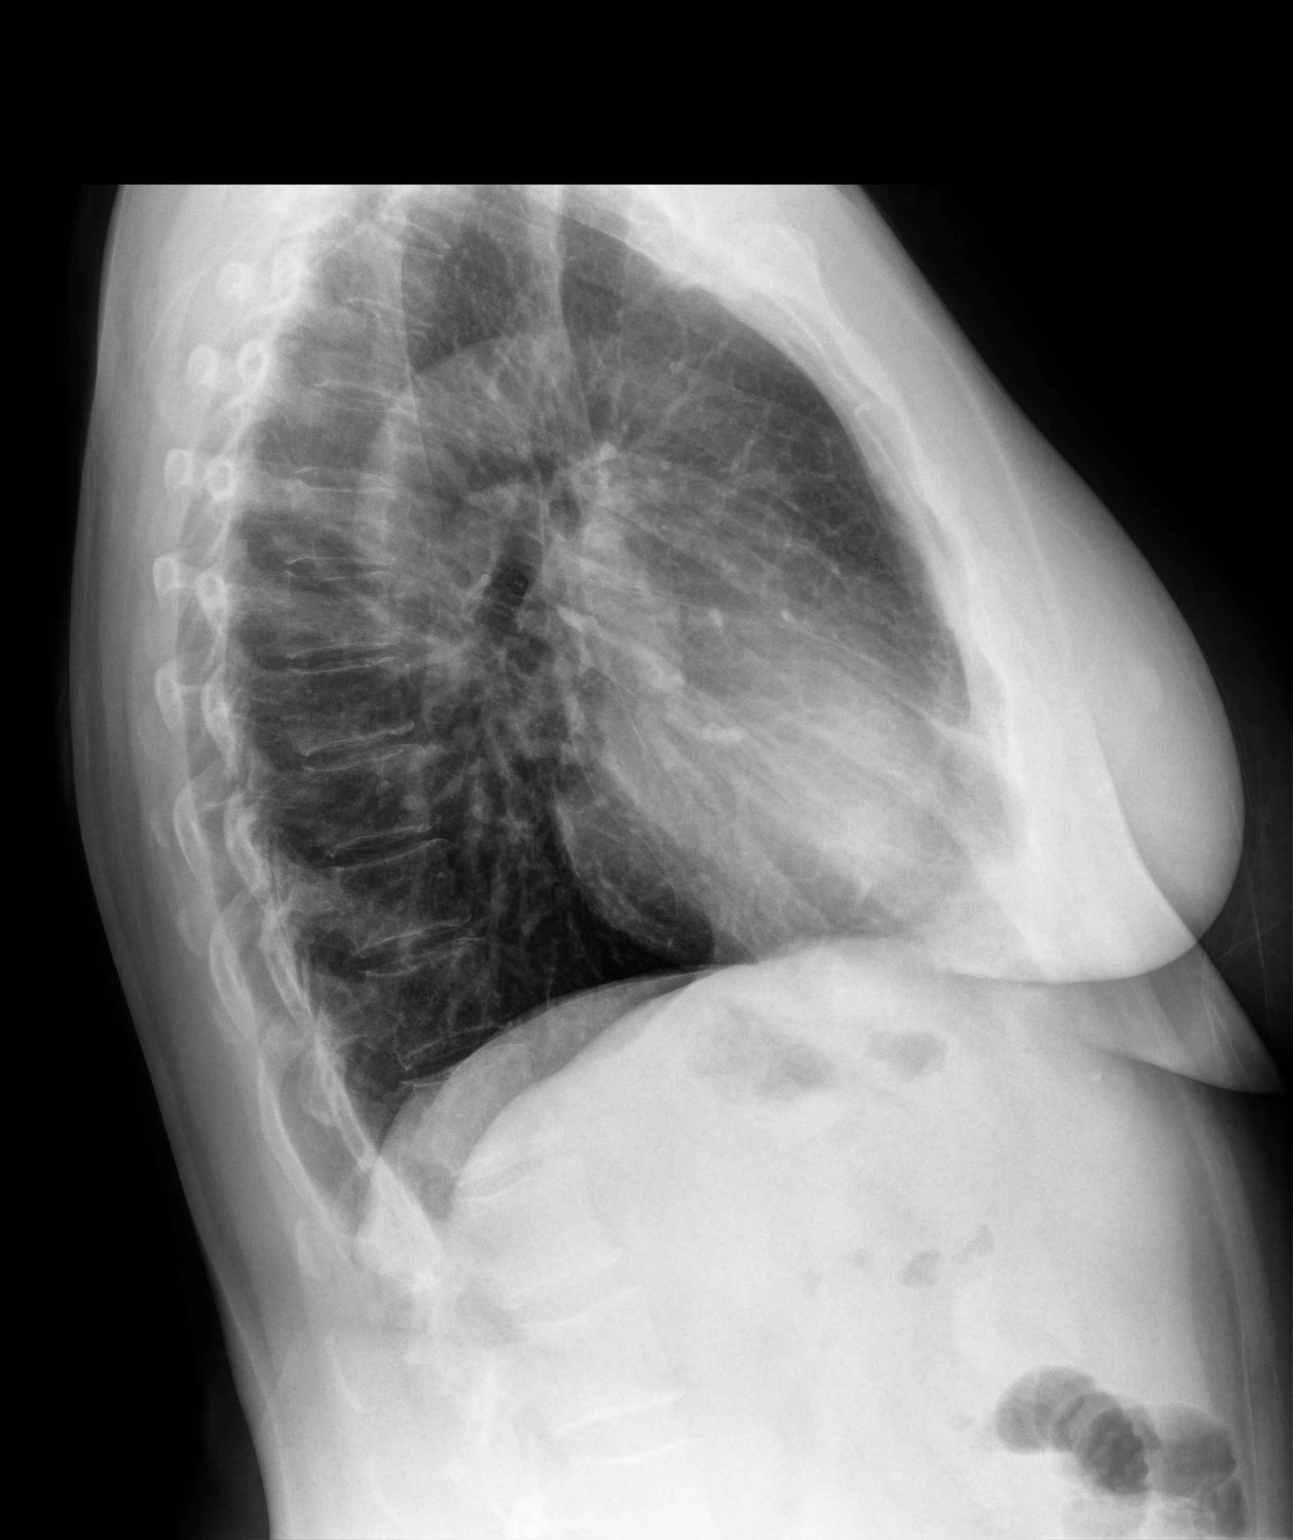

[2 of 2 positions shown; findings below may reference images not displayed]

FINDINGS: Lungs are clear. No consolidation. No effusion. Normal cardiac size 
and pulmonary vascularity. No pneumothorax. No acute osseous abnormality.
IMPRESSION: No acute cardiopulmonary findings.
